# Patient Record
Sex: Female | Born: 1950 | Race: White | Hispanic: No | Smoking: Never smoker
Health system: Southern US, Community
[De-identification: ages and names within clinical notes are randomized; demographics above are authoritative.]

## PROBLEM LIST (undated history)

## (undated) DIAGNOSIS — Z8619 Personal history of other infectious and parasitic diseases: Secondary | ICD-10-CM

## (undated) DIAGNOSIS — I10 Essential (primary) hypertension: Secondary | ICD-10-CM

## (undated) DIAGNOSIS — R011 Cardiac murmur, unspecified: Secondary | ICD-10-CM

## (undated) HISTORY — DX: Cardiac murmur, unspecified: R01.1

## (undated) HISTORY — DX: Essential (primary) hypertension: I10

## (undated) HISTORY — DX: Personal history of other infectious and parasitic diseases: Z86.19

---

## 1986-03-09 HISTORY — PX: OVARIAN CYST SURGERY: SHX726

## 1988-11-07 HISTORY — PX: LASER ABLATION/CAUTERIZATION OF ENDOMETRIAL IMPLANTS: SHX1951

## 2009-03-09 LAB — HM MAMMOGRAPHY

## 2009-03-09 LAB — HM PAP SMEAR

## 2011-12-25 ENCOUNTER — Ambulatory Visit (INDEPENDENT_AMBULATORY_CARE_PROVIDER_SITE_OTHER): Payer: BC Managed Care – PPO | Admitting: Internal Medicine

## 2011-12-25 ENCOUNTER — Encounter: Payer: Self-pay | Admitting: Internal Medicine

## 2011-12-25 VITALS — BP 172/92 | HR 76 | Temp 97.9°F | Ht 63.75 in | Wt 138.0 lb

## 2011-12-25 DIAGNOSIS — I10 Essential (primary) hypertension: Secondary | ICD-10-CM | POA: Insufficient documentation

## 2011-12-25 DIAGNOSIS — R5383 Other fatigue: Secondary | ICD-10-CM

## 2011-12-25 DIAGNOSIS — R5381 Other malaise: Secondary | ICD-10-CM

## 2011-12-25 DIAGNOSIS — R079 Chest pain, unspecified: Secondary | ICD-10-CM

## 2011-12-25 LAB — COMPREHENSIVE METABOLIC PANEL
ALT: 27 U/L (ref 0–35)
Albumin: 4.4 g/dL (ref 3.5–5.2)
CO2: 29 mEq/L (ref 19–32)
Chloride: 103 mEq/L (ref 96–112)
GFR: 102.87 mL/min (ref >60.00)
Glucose, Bld: 76 mg/dL (ref 70–99)
Potassium: 4.7 mEq/L (ref 3.5–5.1)
Sodium: 139 mEq/L (ref 135–145)
Total Bilirubin: 0.5 mg/dL (ref 0.3–1.2)
Total Protein: 7.4 g/dL (ref 6.0–8.3)

## 2011-12-25 LAB — CBC WITH DIFFERENTIAL/PLATELET
Basophils Absolute: 0.1 10*3/uL (ref 0.0–0.1)
Eosinophils Relative: 1.5 % (ref 0.0–5.0)
HCT: 39.6 % (ref 36.0–46.0)
Lymphocytes Relative: 21 % (ref 12.0–46.0)
Lymphs Abs: 2.1 10*3/uL (ref 0.7–4.0)
Monocytes Relative: 6 % (ref 3.0–12.0)
Neutrophils Relative %: 70.8 % (ref 43.0–77.0)
Platelets: 242 10*3/uL (ref 150.0–400.0)
RDW: 12.9 % (ref 11.5–14.6)
WBC: 9.9 10*3/uL (ref 4.5–10.5)

## 2011-12-25 LAB — POCT URINALYSIS DIPSTICK
Blood, UA: NEGATIVE
Glucose, UA: NEGATIVE
Spec Grav, UA: 1.02
Urobilinogen, UA: 0.2
pH, UA: 7

## 2011-12-25 LAB — TSH: TSH: 0.49 u[IU]/mL (ref 0.35–5.50)

## 2011-12-25 MED ORDER — TRIAMCINOLONE ACETONIDE 0.1 % EX CREA: TOPICAL_CREAM | Freq: Two times a day (BID) | CUTANEOUS | Status: DC

## 2011-12-25 MED ORDER — LISINOPRIL 10 MG PO TABS: 10.0000 mg | ORAL_TABLET | Freq: Every day | ORAL | Status: DC

## 2011-12-25 NOTE — Patient Instructions (Addendum)
It was nice meeting you today.  I am going to start you on a blood pressure medicine (Lisinopril).  Take one per day.  We will notify you of your lab results once they become available.  Stress test will be scheduled today.  If any problems - needs evaluation.

## 2011-12-27 ENCOUNTER — Encounter: Payer: Self-pay | Admitting: Internal Medicine

## 2011-12-27 NOTE — Assessment & Plan Note (Signed)
Blood pressure elevated.  New diagnosis.  Check metabolic panel and urinalysis.  Start Lisinopril 10mg  q day.  Will need Cr and potassium checked 10-14 days after starting an ACE inhibitor.

## 2011-12-27 NOTE — Progress Notes (Signed)
Subjective:    Patient ID: Ariana Hartman, female    DOB: April 03, 1950, 61 y.o.   MRN: [id number]  HPI 61 year old female who presents to establish care.  Went to a vascular screening this week.  Blood pressure was noted to be 221/112.  They wanted an ER evaluation.  She declined.  States she has never been told she has high blood pressure.  Was last evaluated by Dr Carola Frost in [place].  Had mammogram and pap - 2011.  (Last time seen).  She does report increased anxiety and stress - mostly with her job.  Her family has moved.  She is trying to help take care of her father who resides in [place] in [place].    She does report increased chest pain - intermittent episodes.  She has related these pains to increased stress.  Will occasionally be sharp and then dull.  May lasts one to two minutes.  Some sob.  No nausea or vomiting.  She had a head injury at work (04/2011).  Had staple.  Negative MRI.  Two weeks prior to this injury, she tripped over her cat.  Hit right side of her head on the door jam.  Since, the right side of her head will feel "hot" at times.  Some intermittent headaches.  No significant headaches and no dizziness.  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  No vision change.   She also reports noticing a rash, flaking and itching over her elbows and knees.  Has used otc creams without relief.  Has not used any steroid creams.    Past Medical History  Diagnosis Date  . History of chicken pox   . Heart murmur   . Hypertension     Review of Systems No significant light headedness or dizziness.  No palpitations.  No cough or congestion.  Chest pain and sob as outlined above.   No nausea or vomiting.  No abdominal pain or cramping.  No bowel change, such as diarrhea, constipation, BRBPR or melana.  No urine change.        Objective:   Physical Exam Filed Vitals:   12/25/11 0919  BP: 172/92  Pulse: 76  Temp: 97.9 F (36.6 C)   Blood pressure recheck:  43/88  61year old female in no acute  distress.   HEENT:  Nares - clear.  OP- without lesions or erythema.  NECK:  Supple, nontender.  No audible carotid bruit.   HEART:  Appears to be regular.  I-II/VI systolic murmur.  (pt aware).   LUNGS:  Without crackles or wheezing audible.  Respirations even and unlabored.   RADIAL PULSE:  Equal bilaterally.  ABDOMEN:  Soft, nontender.  No audible abdominal bruit.   EXTREMITIES:  No increased edema to be present.  DP pulses palpable and equal bilaterally.  (2 plus) SKIN:  Erythematous based - white scaly rash over elbows and knees.       Assessment & Plan:  RASH.  Appears to be consistent with possible psoriasis.  Will give her a trial of Triamcinolone cream .1%.  Apply to affected area bid for 7-10 days.  Follow.   CARDIOVASCULAR.  Chest pain as outlined.  EKG obtained and revealed SR with no acute ischemic changes.  Schedule a stress echo to confirm no active ischemia.  Continue risk factor modification.   FATIGUE.  Check cbc, met c and tsh.   INCREASED PSYCHOSOCIAL STRESSORS.  Discussed at length with her today.  Will treat blood pressure.  Follow  closely.  Does not feel she needs any further intervention at this point.   HEALTH MAINTENANCE.  Will get her physical scheduled.  Will discuss further health maintenance issues with her at her physical.

## 2011-12-30 ENCOUNTER — Telehealth: Payer: Self-pay | Admitting: Internal Medicine

## 2011-12-30 NOTE — Telephone Encounter (Signed)
Pt says she received a call for test results and was needing a call back

## 2012-01-01 ENCOUNTER — Encounter: Payer: Self-pay | Admitting: Internal Medicine

## 2012-01-01 ENCOUNTER — Ambulatory Visit (INDEPENDENT_AMBULATORY_CARE_PROVIDER_SITE_OTHER): Payer: BC Managed Care – PPO | Admitting: Internal Medicine

## 2012-01-01 VITALS — BP 140/88 | HR 63 | Temp 97.8°F | Ht 63.75 in | Wt 140.8 lb

## 2012-01-01 DIAGNOSIS — I10 Essential (primary) hypertension: Secondary | ICD-10-CM

## 2012-01-01 NOTE — Telephone Encounter (Signed)
Patient was seen in office today and results were given per Dr Lorin Picket.

## 2012-01-01 NOTE — Progress Notes (Signed)
  Subjective:    Patient ID: Ariana Hartman, female    DOB: 1950/08/17, 61 y.o.   MRN: [id number]  HPI 61 year old female with past history of newly diagnosed hypertension who comes in today as a work in.  She was scheduled to follow up on her blood pressure next week.  Blood pressure on initial check here was improved.  States she started feeling anxious a few days ago.  Noticed increased heart rate when this occurred.  She states that through the day (that day) - she felt drunk and felt like she was slurring her words.  Her coworkers did not appreciate any slurring of speech.  States she felt a little "drunk".  Noticed that evening what she reports as hallucinations.  States she was seeing tiny insects crawling on the floor.  This resolved after that night.  She is having some headache now.  She relates this to starting the Lisinopril.  Planning to get her stress test when she leaves here.   Past Medical History  Diagnosis Date  . History of chicken pox   . Heart murmur   . Hypertension     Review of Systems Patient reports headache and some intermittent dizziness as outlined above.  Still with some intermittent chest pain, but reports no tightness or palpitations. No pain currently.  No increased shortness of breath, cough or congestion.  No nausea or vomiting.  No abdominal pain or cramping.  No bowel change, such as diarrhea, constipation, BRBPR or melana.  No urine change.  Still dealing with increased stress.      Objective:   Physical Exam Filed Vitals:   01/01/12 1009  BP: 140/88  Pulse: 63  Temp: 97.8 F (71.25 C)   61 year old female in no acute distress.   HEENT:  Nares - clear.  OP- without lesions or erythema.  NECK:  Supple, nontender.  No audible carotid bruit.   HEART:  Appears to be regular. LUNGS:  Without crackles or wheezing audible.  Respirations even and unlabored.   RADIAL PULSE:  Equal bilaterally.  ABDOMEN:  Soft, nontender.  No audible abdominal bruit.     EXTREMITIES:  No increased edema to be present.                    Assessment & Plan:  HEADACHE.  She relates the headache to starting the Lisinopril.  We discussed this at length today along with the hallucinations and her perception of slurred speech.  Discussed the need for further w/up - including MRI (brain) and carotid ultrasound.  She declines any further w/up currently.  Discussed the importance of early detection, etc.  Will notify me if she changes her mind.  Start daily aspirin.  If any reoccurring symptoms or persistent headache, needs reevaluation.   CARDIOVASCULAR.  Is scheduled for the stress test today.  Continue risk factor modification.    INCREASED PSYCHOSOCIAL STRESSORS.  Discussed again with her today.  Follow.

## 2012-01-01 NOTE — Patient Instructions (Signed)
It was nice seeing you today.  I am sorry you have been having some issues. I do want to go ahead and increase you blood pressure medicine to 20mg  of lisinopril.  You have 10mg  tablets.  You will need to take two of these daily.  Monitor blood pressure and let me know if persistent elevation.  If any acute change, I want you evaluated - as we discussed.

## 2012-01-03 ENCOUNTER — Encounter: Payer: Self-pay | Admitting: Internal Medicine

## 2012-01-03 NOTE — Assessment & Plan Note (Signed)
Blood pressure elevated on my recheck.  Increase Lisinopril to 20mg  q day.  Have her monitor her blood pressure closely.  Check met b.  Get her back in soon to reassess.  If persistent elevation, will change the Lisinopril to Lisinopril/HCTZ.

## 2012-01-04 ENCOUNTER — Ambulatory Visit: Payer: BC Managed Care – PPO | Admitting: Internal Medicine

## 2012-01-05 ENCOUNTER — Telehealth: Payer: Self-pay | Admitting: Internal Medicine

## 2012-01-05 NOTE — Telephone Encounter (Signed)
Notify pt stress echo - negative for ischemia.  Ok.

## 2012-01-06 NOTE — Telephone Encounter (Signed)
Patient returned call and I informed her of her results.

## 2012-01-06 NOTE — Telephone Encounter (Signed)
Left message at home for patient to return call.

## 2012-01-08 ENCOUNTER — Other Ambulatory Visit (INDEPENDENT_AMBULATORY_CARE_PROVIDER_SITE_OTHER): Payer: BC Managed Care – PPO

## 2012-01-08 DIAGNOSIS — I10 Essential (primary) hypertension: Secondary | ICD-10-CM

## 2012-01-09 LAB — BASIC METABOLIC PANEL WITH GFR
Calcium: 9.5 mg/dL (ref 8.4–10.5)
GFR, Est African American: >89 mL/min
GFR, Est Non African American: >89 mL/min
Glucose, Bld: 85 mg/dL (ref 70–99)
Potassium: 4.8 mEq/L (ref 3.5–5.3)
Sodium: 138 mEq/L (ref 135–145)

## 2012-01-12 ENCOUNTER — Telehealth: Payer: Self-pay | Admitting: Internal Medicine

## 2012-01-12 NOTE — Telephone Encounter (Signed)
°  Pt called to get refill on her bp meds Lisinopril 10mg  pills.  Take 2 pills daily [facility] [address] Pt is completely out of her meds

## 2012-01-15 ENCOUNTER — Telehealth: Payer: Self-pay | Admitting: *Deleted

## 2012-01-15 NOTE — Telephone Encounter (Signed)
Patient was able to get an early refill due to increased dose, has plenty of meds until seen by Dr. Lorin Picket on 111/18. increased family stress ( family moving out of [place])but coping.

## 2012-01-20 ENCOUNTER — Encounter: Payer: Self-pay | Admitting: *Deleted

## 2012-01-20 NOTE — Telephone Encounter (Signed)
See phone note dated 01-12-12 in regards to refills

## 2012-01-25 ENCOUNTER — Ambulatory Visit (INDEPENDENT_AMBULATORY_CARE_PROVIDER_SITE_OTHER): Payer: BC Managed Care – PPO | Admitting: Internal Medicine

## 2012-01-25 ENCOUNTER — Encounter: Payer: Self-pay | Admitting: Internal Medicine

## 2012-01-25 VITALS — BP 136/84 | HR 65 | Temp 98.1°F | Ht 63.75 in | Wt 140.2 lb

## 2012-01-25 DIAGNOSIS — R21 Rash and other nonspecific skin eruption: Secondary | ICD-10-CM

## 2012-01-25 DIAGNOSIS — I10 Essential (primary) hypertension: Secondary | ICD-10-CM

## 2012-01-25 DIAGNOSIS — H538 Other visual disturbances: Secondary | ICD-10-CM

## 2012-01-25 MED ORDER — LISINOPRIL-HYDROCHLOROTHIAZIDE 20-12.5 MG PO TABS: 1.0000 | ORAL_TABLET | Freq: Every day | ORAL | Status: DC

## 2012-01-25 NOTE — Patient Instructions (Addendum)
It was good to see you today.  I am sorry you have not been feeling well.  We are going to schedule a MRI of your brain and a carotid ultrasound. You need to be evaluated if you have any reoccurring problems.

## 2012-02-07 ENCOUNTER — Encounter: Payer: Self-pay | Admitting: Internal Medicine

## 2012-02-07 NOTE — Assessment & Plan Note (Addendum)
Blood pressure still elevated.  Will stop lisinopril and start lisinopril/HCTZ 20/12.5 q day.  Have her follow her pressures.  Get her back in soon to reassess.  Recent metabolic panel wnl.

## 2012-02-07 NOTE — Progress Notes (Signed)
Subjective:    Patient ID: Ariana Hartman, female    DOB: [birth year], 61 y.o.   MRN: [id number]  HPI 62 year old female with past history of hypertension who comes in today for a scheduled follow up.  Blood pressure still elevated.  Taking Lisinopril 20mg  q day.  States she just doesn't feel well.  No energy.  Does have some depression.  Family moving away.  Taking care of her father.  Increased stress at work.  Just had a stress test that was negative for ischemia.  She had an episode recently where she noticed left eye blurring.  Noticed after she awoke.  Lasted for several hours.  States she just could not focus while trying to read.  No actual vision loss.  She notices some increased gas and nausea.  Some headache - persistent.  She has attributed this to the lisinopril.  No slurring of speech and no other focal neuro deficits.  Eating and drinking.  No vomiting.  Bowels stable.    Past Medical History  Diagnosis Date  . History of chicken pox   . Heart murmur   . Hypertension     Review of Systems Patient denies any lightheadedness or dizziness.  She does report headache.  Persistent.  Worsening at times.  No further vision change.   No chest pain, tightness or palpitations.  No increased shortness of breath, cough or congestion.  No vomiting.  Some occasional nausea and increased gas.  No abdominal pain or cramping.  No bowel change, such as diarrhea, constipation, BRBPR or melana.  No urine change.   Increased stress.  No suicidal ideations currently.  States she would not hurt herself.  Discussed counseling.  She declines.       Objective:   Physical Exam Filed Vitals:   01/25/12 0911  BP: 136/84  Pulse: 65  Temp: 98.1 F (36.7 C)   Blood pressure recheck:  58/78  60 year old female in no acute distress.   HEENT:  Nares - clear.  OP- without lesions or erythema.  NECK:  Supple, nontender.  No audible bruit.   HEART:  Appears to be regular. LUNGS:  Without crackles or  wheezing audible.  Respirations even and unlabored.   RADIAL PULSE:  Equal bilaterally.  ABDOMEN:  Soft, nontender.  No audible abdominal bruit.   EXTREMITIES:  No increased edema to be present.                     Assessment & Plan:  HEADACHE.  Persistent problem.  Treating blood pressure.  Given persistent/new headache - will obtain MRI to confirm no structural abnormality.  Adjust blood pressure medicine as outlined.    VISUAL DISTURBANCE.  Describes the blurring as outlined.  No actual vision loss.  Hard to focus.  No slurring of speech or other neurological changes.  Continue daily aspirin.  Obtain MRI brain and carotid ultrasound.  Will also have her see neurology.  If any reoccurrence or problems, she is to be reevaluated immediately.   INCREASED PSYCHOSOCIAL STRESSORS.  See above.  Discussed counseling.  She declines.  Assures me she will not hurt herself.  She would like to hold on medication.  Follow.  She is to be evaluated if any problems or symptom change.    CARDIOVASCULAR.  Recent stress test negative.  Currently asymptomatic.  Follow.  Continue risk factor modification.   PERSISTENT RASH.  Triamcinolone cream did not help.  Will refer to dermatology for  evaluation and treatment.

## 2012-02-08 ENCOUNTER — Ambulatory Visit: Payer: Self-pay | Admitting: Internal Medicine

## 2012-02-09 ENCOUNTER — Telehealth: Payer: Self-pay | Admitting: Internal Medicine

## 2012-02-09 NOTE — Telephone Encounter (Signed)
Notify pt mri (brain) - ok.  No acute findings.

## 2012-02-10 ENCOUNTER — Telehealth: Payer: Self-pay | Admitting: Internal Medicine

## 2012-02-10 NOTE — Telephone Encounter (Signed)
Notify pt carotid ultrasound - shows no hemodynamically significant stenosis.  Ok.  She also needs a follow up appt with me within the next 2-3 weeks.  Please schedule a 30 min appt

## 2012-02-10 NOTE — Telephone Encounter (Signed)
Patient returned your call the radiologist had told her the results of her MRI.

## 2012-02-10 NOTE — Telephone Encounter (Signed)
Called with MRI results;voice messaging system not set up. Will call tomorrow.

## 2012-02-12 NOTE — Telephone Encounter (Signed)
Lft msg on patients voice mail regarding results and for her to call office for appoint with Dr. Lorin Picket in 3 wks.

## 2012-02-13 ENCOUNTER — Telehealth: Payer: Self-pay | Admitting: Internal Medicine

## 2012-02-13 NOTE — Telephone Encounter (Signed)
Please call patient and schedule a follow up appt with me in 2-3 weeks (to follow up on her blood pressure).  Will need 30 min.  Thanks.

## 2012-02-16 NOTE — Telephone Encounter (Signed)
Left message for pt to call office

## 2012-02-17 NOTE — Telephone Encounter (Signed)
Ariana Hartman made appointment 12/20

## 2012-02-18 NOTE — Telephone Encounter (Signed)
Per Patient appoint scheduled 12/20/@10 :00

## 2012-02-22 ENCOUNTER — Encounter: Payer: Self-pay | Admitting: Internal Medicine

## 2012-02-23 ENCOUNTER — Telehealth: Payer: Self-pay | Admitting: Internal Medicine

## 2012-02-23 NOTE — Telephone Encounter (Signed)
Needs a refill on lisinopril/HCTZ. Please send to [facility] [address]

## 2012-02-25 NOTE — Telephone Encounter (Signed)
This has already been filled and picked up by patient

## 2012-02-26 ENCOUNTER — Ambulatory Visit (INDEPENDENT_AMBULATORY_CARE_PROVIDER_SITE_OTHER): Payer: BC Managed Care – PPO | Admitting: Internal Medicine

## 2012-02-26 ENCOUNTER — Encounter: Payer: Self-pay | Admitting: Internal Medicine

## 2012-02-26 VITALS — BP 156/92 | HR 71 | Temp 98.2°F | Ht 63.75 in | Wt 139.8 lb

## 2012-02-26 DIAGNOSIS — I10 Essential (primary) hypertension: Secondary | ICD-10-CM

## 2012-02-26 LAB — BASIC METABOLIC PANEL
Chloride: 100 mEq/L (ref 96–112)
GFR: 100.96 mL/min (ref >60.00)
Potassium: 4.3 mEq/L (ref 3.5–5.1)
Sodium: 140 mEq/L (ref 135–145)

## 2012-02-26 NOTE — Patient Instructions (Signed)
I am glad you are feeling better.  I want you to start Zantac (ranitidine) 150mg  - one per day.  Let me know if you have any more problems.

## 2012-02-27 ENCOUNTER — Encounter: Payer: Self-pay | Admitting: Internal Medicine

## 2012-02-27 NOTE — Progress Notes (Signed)
Subjective:    Patient ID: Ariana Hartman, female    DOB: [birth year], 61 y.o.   MRN: [id number]  HPI 61 year old female with past history of hypertension who comes in today for a scheduled follow up.  States she feels better.  Still under a lot of stress with her house.  Needing a lot of repairs.  Increased financial concerns.  Her father is now staying with her sister.  This has helped her stress.  Blood pressure has been doing better.  States averaging 114-119/70s.  Feels better.  No slurring of speech and no other focal neuro deficits.  Did see neurology.  Felt to have possibly had a TIA.  Taking aspirin daily now.  No reoccurring symptoms.  Eating and drinking well.   No vomiting.  Bowels stable.    Past Medical History  Diagnosis Date  . History of chicken pox   . Heart murmur   . Hypertension     Current Outpatient Prescriptions on File Prior to Visit  Medication Sig Dispense Refill  . b complex vitamins tablet Take 1 tablet by mouth daily.      Marland Kitchen lisinopril-hydrochlorothiazide (ZESTORETIC) 20-12.5 MG per tablet Take 1 tablet by mouth daily.  30 tablet  2  . triamcinolone cream (KENALOG) 0.1 % Apply topically 2 (two) times daily. Apply to affected areas bid for 7-10 days.  30 g  0    Review of Systems Patient denies any lightheadedness or dizziness. No significant headache.  No vision change.   No chest pain, tightness or palpitations.  No increased shortness of breath, cough or congestion.  No vomiting.  No abdominal pain or cramping.  She does describe some burning in her chest.  Notices more in the am.  No pain with increased activity or exertion.  No bowel change, such as diarrhea, constipation, BRBPR or melana.  No urine change.   Increased stress.  Feels she is handling things relatively well.       Objective:   Physical Exam  Filed Vitals:   02/26/12 1005  BP: 156/92  Pulse: 71  Temp: 98.2 F (36.8 C)   Blood pressure recheck:  136-28/95-62  61 year old female in  no acute distress.   HEENT:  Nares - clear.  OP- without lesions or erythema.  NECK:  Supple, nontender.  No audible bruit.   HEART:  Appears to be regular. LUNGS:  Without crackles or wheezing audible.  Respirations even and unlabored.   RADIAL PULSE:  Equal bilaterally.  ABDOMEN:  Soft, nontender.  No audible abdominal bruit.   EXTREMITIES:  No increased edema to be present.                     Assessment & Plan:  HEADACHE.  Not a problem now.  Blood pressure better.  Follow.      VISUAL DISTURBANCE.  Described the blurring as outlined previously.  See previous notes.  Had MRI and carotid ultrasound.  Carotid ultrasound revealed no significant stenosis.  MRI revealed no acute changes.  Saw Neurology.  See Dr Daisy Blossom note for details.  Previous changes felt to possibly be due to a TIA.  Continue daily aspirin.  Follow.  Has had no reoccurring symptoms.    INCREASED PSYCHOSOCIAL STRESSORS.  Doing better. Follow.    CARDIOVASCULAR.  Recent stress test negative.  Currently asymptomatic.  Follow.  Continue risk factor modification.   PERSISTENT RASH.  Triamcinolone cream did not help.  Referred to dermatology  for evaluation and treatment.  Has appt 03/10/12.

## 2012-02-27 NOTE — Assessment & Plan Note (Signed)
Blood pressure under better control.  Continue same medication.  Follow.  Check metabolic panel today.

## 2012-03-21 ENCOUNTER — Other Ambulatory Visit: Payer: Self-pay | Admitting: Internal Medicine

## 2012-03-21 ENCOUNTER — Other Ambulatory Visit: Payer: Self-pay | Admitting: General Practice

## 2012-03-21 MED ORDER — LISINOPRIL-HYDROCHLOROTHIAZIDE 20-12.5 MG PO TABS: 1.0000 | ORAL_TABLET | Freq: Every day | ORAL | Status: DC

## 2012-03-21 NOTE — Telephone Encounter (Signed)
Sent in to pharmacy.  

## 2012-03-21 NOTE — Telephone Encounter (Signed)
Pt is needing refill of Lisinopril HCTZ 20-125 mg. Pt says a 90 day supply is much cheaper. [facility] in [place]

## 2012-05-27 ENCOUNTER — Ambulatory Visit (INDEPENDENT_AMBULATORY_CARE_PROVIDER_SITE_OTHER): Payer: BC Managed Care – PPO | Admitting: Internal Medicine

## 2012-05-27 ENCOUNTER — Encounter: Payer: Self-pay | Admitting: Internal Medicine

## 2012-05-27 VITALS — BP 110/70 | HR 79 | Temp 98.4°F | Ht 63.75 in | Wt 142.0 lb

## 2012-05-27 DIAGNOSIS — Z1239 Encounter for other screening for malignant neoplasm of breast: Secondary | ICD-10-CM

## 2012-05-27 DIAGNOSIS — G459 Transient cerebral ischemic attack, unspecified: Secondary | ICD-10-CM

## 2012-05-27 DIAGNOSIS — I10 Essential (primary) hypertension: Secondary | ICD-10-CM

## 2012-05-27 DIAGNOSIS — L408 Other psoriasis: Secondary | ICD-10-CM

## 2012-05-27 DIAGNOSIS — L409 Psoriasis, unspecified: Secondary | ICD-10-CM

## 2012-05-27 MED ORDER — LISINOPRIL-HYDROCHLOROTHIAZIDE 20-12.5 MG PO TABS: 1.0000 | ORAL_TABLET | Freq: Every day | ORAL | Status: DC

## 2012-05-29 ENCOUNTER — Encounter: Payer: Self-pay | Admitting: Internal Medicine

## 2012-05-29 DIAGNOSIS — L409 Psoriasis, unspecified: Secondary | ICD-10-CM | POA: Insufficient documentation

## 2012-05-29 DIAGNOSIS — G459 Transient cerebral ischemic attack, unspecified: Secondary | ICD-10-CM | POA: Insufficient documentation

## 2012-05-29 NOTE — Assessment & Plan Note (Signed)
Blood pressure under better control.  Continue same medication.  Follow.  Check metabolic panel.

## 2012-05-29 NOTE — Progress Notes (Signed)
  Subjective:    Patient ID: Ariana Hartman, female    DOB: Jun 17, 1950, 62 y.o.   MRN: [id number]  HPI 62 year old female with past history of hypertension who comes in today for a scheduled follow up.  States she feels better.  She reports feeling good.  She was able to get her house fixed.  Handling stress well.  Blood pressure has been under good control.  No headache or lightheadedness/dizziness.  No chest pain or tightness.  Breathing doing well.  Seeing dermatology for her psoriasis.  The rx cream is helping.  Bowels stable.    Past Medical History  Diagnosis Date  . History of chicken pox   . Heart murmur   . Hypertension     Review of Systems Patient denies any lightheadedness or dizziness. No significant headache.  No vision change.   No chest pain, tightness or palpitations.  No increased shortness of breath, cough or congestion.  No vomiting.  No abdominal pain or cramping.  No bowel change, such as diarrhea, constipation, BRBPR or melana.  No urine change.   Overall she feels she is doing well.        Objective:   Physical Exam  Filed Vitals:   05/27/12 1011  BP: 110/70  Pulse: 79  Temp: 98.4 F (36.9 C)   Blood pressure recheck:  120/72, pulse 42  62 year old female in no acute distress.   HEENT:  Nares - clear.  OP- without lesions or erythema.  NECK:  Supple, nontender.  No audible bruit.   HEART:  Appears to be regular. LUNGS:  Without crackles or wheezing audible.  Respirations even and unlabored.   RADIAL PULSE:  Equal bilaterally.  ABDOMEN:  Soft, nontender.  No audible abdominal bruit.   EXTREMITIES:  No increased edema to be present.                     Assessment & Plan:  HEADACHE.  Not a problem now.  Blood pressure better.  Follow.      VISUAL DISTURBANCE.  Described the blurring as outlined previously.  See previous notes.  Had MRI and carotid ultrasound.  Carotid ultrasound revealed no significant stenosis.  MRI revealed no acute changes.  Saw  Neurology.  See Dr Daisy Blossom note for details.  Previous changes felt to possibly be due to a TIA.  Continue daily aspirin.  Follow.  Has had no reoccurring symptoms.    INCREASED PSYCHOSOCIAL STRESSORS.  Doing better. Follow.    CARDIOVASCULAR.  Recent stress test negative.  Currently asymptomatic.  Follow.  Continue risk factor modification.   HEALTH MAINTENANCE.  Schedule her for a physical next visit.  Schedule mammogram.  Discuss further health maintenance issues with her then.  Check cholesterol.

## 2012-05-29 NOTE — Assessment & Plan Note (Signed)
No reoccurring symptoms.  Continue daily aspirin.  Follow.   

## 2012-05-29 NOTE — Assessment & Plan Note (Signed)
Seeing dermatology.  Cream helping.  Follow.   

## 2012-06-10 ENCOUNTER — Other Ambulatory Visit (INDEPENDENT_AMBULATORY_CARE_PROVIDER_SITE_OTHER): Payer: BC Managed Care – PPO

## 2012-06-10 DIAGNOSIS — I10 Essential (primary) hypertension: Secondary | ICD-10-CM

## 2012-06-10 LAB — COMPREHENSIVE METABOLIC PANEL
Albumin: 4.4 g/dL (ref 3.5–5.2)
Alkaline Phosphatase: 75 U/L (ref 39–117)
BUN: 17 mg/dL (ref 6–23)
Calcium: 9.1 mg/dL (ref 8.4–10.5)
Chloride: 103 mEq/L (ref 96–112)
Glucose, Bld: 95 mg/dL (ref 70–99)
Potassium: 4.5 mEq/L (ref 3.5–5.1)

## 2012-06-10 LAB — LDL CHOLESTEROL, DIRECT: Direct LDL: 152.6 mg/dL

## 2012-06-10 LAB — LIPID PANEL: HDL: 72 mg/dL (ref >39.00)

## 2012-06-22 ENCOUNTER — Telehealth: Payer: Self-pay

## 2012-06-22 NOTE — Telephone Encounter (Signed)
Please close encounter

## 2012-06-22 NOTE — Telephone Encounter (Signed)
Returned pt phone message that she left on the voicemail about some lab work. Left message for pt  to give a call back to the office.

## 2012-07-12 ENCOUNTER — Ambulatory Visit: Payer: Self-pay | Admitting: Internal Medicine

## 2012-07-18 ENCOUNTER — Encounter: Payer: Self-pay | Admitting: Internal Medicine

## 2012-07-27 ENCOUNTER — Ambulatory Visit (INDEPENDENT_AMBULATORY_CARE_PROVIDER_SITE_OTHER): Payer: BC Managed Care – PPO | Admitting: Internal Medicine

## 2012-07-27 ENCOUNTER — Encounter: Payer: Self-pay | Admitting: Internal Medicine

## 2012-07-27 ENCOUNTER — Other Ambulatory Visit (HOSPITAL_COMMUNITY)
Admission: RE | Admit: 2012-07-27 | Discharge: 2012-07-27 | Disposition: A | Discharge status: Home / Self Care | Payer: BC Managed Care – PPO | Source: Ambulatory Visit | Attending: Internal Medicine | Admitting: Internal Medicine

## 2012-07-27 VITALS — BP 130/70 | HR 60 | Temp 97.8°F | Ht 64.0 in | Wt 137.8 lb

## 2012-07-27 DIAGNOSIS — E7800 Pure hypercholesterolemia, unspecified: Secondary | ICD-10-CM

## 2012-07-27 DIAGNOSIS — Z124 Encounter for screening for malignant neoplasm of cervix: Secondary | ICD-10-CM

## 2012-07-27 DIAGNOSIS — I10 Essential (primary) hypertension: Secondary | ICD-10-CM

## 2012-07-27 DIAGNOSIS — Z1151 Encounter for screening for human papillomavirus (HPV): Secondary | ICD-10-CM | POA: Insufficient documentation

## 2012-07-27 DIAGNOSIS — L659 Nonscarring hair loss, unspecified: Secondary | ICD-10-CM

## 2012-07-27 DIAGNOSIS — Z01419 Encounter for gynecological examination (general) (routine) without abnormal findings: Secondary | ICD-10-CM | POA: Insufficient documentation

## 2012-07-27 DIAGNOSIS — L409 Psoriasis, unspecified: Secondary | ICD-10-CM

## 2012-07-27 DIAGNOSIS — L408 Other psoriasis: Secondary | ICD-10-CM

## 2012-07-27 DIAGNOSIS — Z1211 Encounter for screening for malignant neoplasm of colon: Secondary | ICD-10-CM

## 2012-07-27 DIAGNOSIS — G459 Transient cerebral ischemic attack, unspecified: Secondary | ICD-10-CM

## 2012-07-29 ENCOUNTER — Telehealth: Payer: Self-pay | Admitting: *Deleted

## 2012-07-29 ENCOUNTER — Encounter: Payer: Self-pay | Admitting: *Deleted

## 2012-07-29 ENCOUNTER — Other Ambulatory Visit (INDEPENDENT_AMBULATORY_CARE_PROVIDER_SITE_OTHER): Payer: BC Managed Care – PPO

## 2012-07-29 DIAGNOSIS — Z1211 Encounter for screening for malignant neoplasm of colon: Secondary | ICD-10-CM

## 2012-07-29 NOTE — Progress Notes (Signed)
LMTCB/ext 105 

## 2012-07-31 ENCOUNTER — Encounter: Payer: Self-pay | Admitting: Internal Medicine

## 2012-07-31 DIAGNOSIS — E7800 Pure hypercholesterolemia, unspecified: Secondary | ICD-10-CM | POA: Insufficient documentation

## 2012-07-31 NOTE — Assessment & Plan Note (Signed)
Seeing dermatology.  Cream helping.  Follow.   

## 2012-07-31 NOTE — Assessment & Plan Note (Signed)
Blood pressure under better control.  Continue same medication.  Follow.  Follow metabolic panel.   

## 2012-07-31 NOTE — Assessment & Plan Note (Signed)
Lipid profile 06/10/12 revealed total cholesterol 229, triglycerides 39, HDL 72 and LDL 153.  Continue low cholesterol diet and exercise.  If cholesterol remains elevated, will require medication.  Follow.

## 2012-07-31 NOTE — Progress Notes (Signed)
Subjective:    Patient ID: Ariana Hartman, female    DOB: [birth year], 62 y.o.   MRN: [id number]  HPI 62 year old female with past history of hypertension who comes in today to follow up on this as well as for a complete physical exam.  States she feels better.  Feels she is handling stress well.  Blood pressure has been under good control.  No headache or lightheadedness/dizziness.  No chest pain or tightness.  Breathing doing well.  Seeing dermatology for her psoriasis.  The rx cream is helping.  Bowels stable.  Some stress with her job.  Looking for a change.  Has noticed some hair loss.  Also reports some vaginal odor.  No significant discharge.  No urine change.  No abdominal pain or cramping.  She has noticed some pain in the base of her right thumb.  Uses her hands a lot.  Some increased swelling.  No increased redness.    Past Medical History  Diagnosis Date  . History of chicken pox   . Heart murmur   . Hypertension     Current Outpatient Prescriptions on File Prior to Visit  Medication Sig Dispense Refill  . b complex vitamins tablet Take 1 tablet by mouth daily.      Marland Kitchen lisinopril-hydrochlorothiazide (ZESTORETIC) 20-12.5 MG per tablet Take 1 tablet by mouth daily.  90 tablet  1   No current facility-administered medications on file prior to visit.    Review of Systems Patient denies any lightheadedness or dizziness. No headache.  No vision change.   No chest pain, tightness or palpitations.  No increased shortness of breath, cough or congestion.  No vomiting.  No acid reflux.  No abdominal pain or cramping.  No bowel change, such as diarrhea, constipation, BRBPR or melana.  No urine change.   Overall she feels she is doing well.  Does report the vaginal odor as outlined.  Some dryness.        Objective:   Physical Exam  Filed Vitals:   07/27/12 1002  BP: 130/70  Pulse: 60  Temp: 97.8 F (36.6 C)   Blood pressure recheck:  84/54  62 year old female in no acute  distress.   HEENT:  Nares- clear.  Oropharynx - without lesions. NECK:  Supple.  Nontender.  No audible bruit.  HEART:  Appears to be regular. LUNGS:  No crackles or wheezing audible.  Respirations even and unlabored.  RADIAL PULSE:  Equal bilaterally.    BREASTS:  No nipple discharge or nipple retraction present.  Could not appreciate any distinct nodules or axillary adenopathy.  ABDOMEN:  Soft, nontender.  Bowel sounds present and normal.  No audible abdominal bruit.  GU:  Normal external genitalia.  Vaginal vault without lesions.  Cervix identified.  Pap performed. Atrophy changes present.  KOH/wet prep sent. Could not appreciate any adnexal masses or tenderness.   RECTAL:  Heme negative.   EXTREMITIES:  No increased edema present.  DP pulses palpable and equal bilaterally.             Assessment & Plan:  HEADACHE.  Not a problem now.  Blood pressure better.  Follow.     MSK.  Pain at the base of the right thumb. Tylenol prn.  Thumb spica.  Follow.     VISUAL DISTURBANCE.  Described the blurring as outlined previously.  See previous notes.  Had MRI and carotid ultrasound.  Carotid ultrasound revealed no significant stenosis.  MRI revealed no acute  changes.  Saw Neurology.  See Dr Daisy Blossom note for details.  Previous changes felt to possibly be due to a TIA.  Continue daily aspirin.  Follow.  Has had no reoccurring symptoms.    INCREASED PSYCHOSOCIAL STRESSORS.  Doing better. Follow.    CARDIOVASCULAR.  Recent stress test negative.  Currently asymptomatic.  Follow.  Continue risk factor modification.   HEALTH MAINTENANCE.  Physical today.  Schedule mammogram.  Needs colonoscopy.  IFOB today.

## 2012-07-31 NOTE — Assessment & Plan Note (Signed)
No reoccurring symptoms.  Continue daily aspirin.  Follow.   

## 2012-08-03 ENCOUNTER — Encounter: Payer: Self-pay | Admitting: *Deleted

## 2012-08-03 NOTE — Telephone Encounter (Signed)
completed

## 2012-08-09 ENCOUNTER — Encounter: Payer: Self-pay | Admitting: Internal Medicine

## 2012-08-26 ENCOUNTER — Telehealth: Payer: Self-pay | Admitting: Internal Medicine

## 2012-08-26 NOTE — Telephone Encounter (Signed)
Please Advise

## 2012-08-26 NOTE — Telephone Encounter (Signed)
Harriett Sine from [place] called to see if she could get a secondary DX code for some labs that the patient had done in the office. You can call her back at [phone]. She was transferred to my voice mail so I am not sure of the Date of service or anything.  Please advise.

## 2012-08-29 NOTE — Telephone Encounter (Signed)
Left message for Ariana Hartman at [facility] [place] [facility] to call back with additional information

## 2012-08-29 NOTE — Telephone Encounter (Signed)
I am not sure what labs they are needing a diagnosis code.  Need more information.  Thanks.

## 2012-09-02 ENCOUNTER — Telehealth: Payer: Self-pay | Admitting: Internal Medicine

## 2012-09-02 NOTE — Telephone Encounter (Signed)
Signed and in your box.

## 2012-09-02 NOTE — Telephone Encounter (Signed)
Form in your folder (just needs your signature)

## 2012-09-02 NOTE — Telephone Encounter (Signed)
Pt dropped off health screening form  °In box °

## 2012-09-02 NOTE — Telephone Encounter (Signed)
Form faxed & left message to notify pt

## 2012-09-20 ENCOUNTER — Telehealth: Payer: Self-pay | Admitting: Internal Medicine

## 2012-09-20 ENCOUNTER — Telehealth: Payer: Self-pay | Admitting: *Deleted

## 2012-09-20 MED ORDER — LISINOPRIL-HYDROCHLOROTHIAZIDE 20-12.5 MG PO TABS: 1.0000 | ORAL_TABLET | Freq: Every day | ORAL | Status: DC

## 2012-09-20 NOTE — Telephone Encounter (Signed)
done

## 2012-09-20 NOTE — Telephone Encounter (Signed)
Refill sent.

## 2012-09-20 NOTE — Telephone Encounter (Signed)
lisinopril-hydrochlorothiazide (ZESTORETIC) 20-12.5 MG per tablet #90

## 2012-09-21 ENCOUNTER — Encounter: Payer: Self-pay | Admitting: Internal Medicine

## 2012-10-17 ENCOUNTER — Other Ambulatory Visit (INDEPENDENT_AMBULATORY_CARE_PROVIDER_SITE_OTHER): Payer: BC Managed Care – PPO

## 2012-10-17 ENCOUNTER — Telehealth: Payer: Self-pay | Admitting: *Deleted

## 2012-10-17 DIAGNOSIS — L659 Nonscarring hair loss, unspecified: Secondary | ICD-10-CM

## 2012-10-17 DIAGNOSIS — E7800 Pure hypercholesterolemia, unspecified: Secondary | ICD-10-CM

## 2012-10-17 DIAGNOSIS — I10 Essential (primary) hypertension: Secondary | ICD-10-CM

## 2012-10-17 LAB — BASIC METABOLIC PANEL
BUN: 16 mg/dL (ref 6–23)
CO2: 27 mEq/L (ref 19–32)
Chloride: 106 mEq/L (ref 96–112)
Glucose, Bld: 81 mg/dL (ref 70–99)
Potassium: 4.3 mEq/L (ref 3.5–5.1)

## 2012-10-17 LAB — HEPATIC FUNCTION PANEL
ALT: 16 U/L (ref 0–35)
Alkaline Phosphatase: 65 U/L (ref 39–117)
Bilirubin, Direct: 0 mg/dL (ref 0.0–0.3)
Total Bilirubin: 0.3 mg/dL (ref 0.3–1.2)
Total Protein: 7 g/dL (ref 6.0–8.3)

## 2012-10-17 LAB — LDL CHOLESTEROL, DIRECT: Direct LDL: 128.1 mg/dL

## 2012-10-17 LAB — LIPID PANEL
HDL: 63.5 mg/dL (ref >39.00)
Total CHOL/HDL Ratio: 3
VLDL: 38 mg/dL (ref 0.0–40.0)

## 2012-10-17 NOTE — Telephone Encounter (Signed)
If she thinks she has an ear infection and can't hear out of her ear, then I think she needs evaluation before 8/25 week.  If work is an issue, then I recommend her going to acute care - open past 5:00.  Seems like can see Raquel most any day.  Thanks.

## 2012-10-17 NOTE — Telephone Encounter (Signed)
Pt came in today and states that she think she has a ear infection, she cant hear out her right ear she states if been like that for a couple weeks, and would like someone to call her, she states she is only available  Aug 25- Aug 29 (to to be able to come in) and if someone can please call her today or tomorrow about thing she will be home  [phone] Best time to reach pt is in the morning

## 2012-10-17 NOTE — Telephone Encounter (Signed)
Patient waiting on a call back to inform her of what she should do.

## 2012-10-18 ENCOUNTER — Encounter: Payer: Self-pay | Admitting: *Deleted

## 2012-10-18 NOTE — Telephone Encounter (Signed)
Pt notified that she needs to be seen. She states that she could come on 8/21 but Dr. Lorin Picket is not in the office. I scheduled her an appt with Raquel

## 2012-10-21 ENCOUNTER — Ambulatory Visit: Payer: BC Managed Care – PPO | Admitting: Internal Medicine

## 2012-10-27 ENCOUNTER — Ambulatory Visit: Payer: BC Managed Care – PPO | Admitting: Adult Health

## 2012-11-14 ENCOUNTER — Telehealth: Payer: Self-pay | Admitting: Internal Medicine

## 2012-11-14 NOTE — Telephone Encounter (Signed)
Patient called stating she is having trouble with coping after the passing on her father. She states she can be working away and just burst into tears. Is there anything that can be called in to help the patient? I also have made an apt for the pt to be seen tomorrow at 915

## 2012-11-14 NOTE — Telephone Encounter (Signed)
Agree.  If she is being seen tomorrow, can discuss with her tomorrow.  Please confirm no acute need that cannot wait until tomorrow.  Thanks.

## 2012-11-14 NOTE — Telephone Encounter (Signed)
Will discuss at appt tomorrow

## 2012-11-15 ENCOUNTER — Ambulatory Visit (INDEPENDENT_AMBULATORY_CARE_PROVIDER_SITE_OTHER): Payer: BC Managed Care – PPO | Admitting: Internal Medicine

## 2012-11-15 ENCOUNTER — Encounter: Payer: Self-pay | Admitting: Internal Medicine

## 2012-11-15 VITALS — BP 122/76 | HR 67 | Temp 97.8°F | Resp 12 | Ht 64.0 in | Wt 134.0 lb

## 2012-11-15 DIAGNOSIS — I10 Essential (primary) hypertension: Secondary | ICD-10-CM

## 2012-11-15 DIAGNOSIS — F439 Reaction to severe stress, unspecified: Secondary | ICD-10-CM

## 2012-11-15 DIAGNOSIS — Z733 Stress, not elsewhere classified: Secondary | ICD-10-CM

## 2012-11-15 DIAGNOSIS — G459 Transient cerebral ischemic attack, unspecified: Secondary | ICD-10-CM

## 2012-11-15 MED ORDER — FLUOXETINE HCL 10 MG PO CAPS: 10.0000 mg | ORAL_CAPSULE | Freq: Every day | ORAL | Status: DC

## 2012-11-15 MED ORDER — LISINOPRIL-HYDROCHLOROTHIAZIDE 20-12.5 MG PO TABS: 1.0000 | ORAL_TABLET | Freq: Every day | ORAL | Status: DC

## 2012-11-18 ENCOUNTER — Encounter: Payer: Self-pay | Admitting: Internal Medicine

## 2012-11-18 DIAGNOSIS — F439 Reaction to severe stress, unspecified: Secondary | ICD-10-CM | POA: Insufficient documentation

## 2012-11-18 NOTE — Progress Notes (Signed)
  Subjective:    Patient ID: Ariana Hartman, female    DOB: 11-23-1950, 62 y.o.   MRN: [id number]  HPI 62 year old female with past history of hypertension who comes in today as a work in with concerns regarding some increased stress/anxiety.  Her father passed away 2012-11-21.  Trying to cope with this.  States she is more emotional.  Cries easily.  Affecting her at work.  Not sleeping well.  When she eats, has some increased gas.  This did not occur until her father's death.   Blood pressure has been under good control.  No headache or lightheadedness/dizziness.  No chest pain or tightness.  Breathing doing well.     Past Medical History  Diagnosis Date  . History of chicken pox   . Heart murmur   . Hypertension     Current Outpatient Prescriptions on File Prior to Visit  Medication Sig Dispense Refill  . b complex vitamins tablet Take 1 tablet by mouth daily.       No current facility-administered medications on file prior to visit.    Review of Systems Patient denies any lightheadedness or dizziness. No headache.  No vision change.   No chest pain, tightness or palpitations.  No increased shortness of breath, cough or congestion.  No vomiting.  No acid reflux.  No abdominal pain or cramping.  No bowel change, such as diarrhea, constipation, BRBPR or melana, but does report the increased gas with eating.  Has been eating better.         Objective:   Physical Exam  Filed Vitals:   11/15/12 0937  BP: 122/76  Pulse: 67  Temp: 97.8 F (36.6 C)  Resp: 6   62 year old female in no acute distress.  HEART:  Appears to be regular. LUNGS:  No crackles or wheezing audible.  Respirations even and unlabored.  RADIAL PULSE:  Equal bilaterally.   ABDOMEN:  Soft, nontender.  Bowel sounds present and normal.  No audible abdominal bruit. Marland Kitchen   EXTREMITIES:  No increased edema present.  DP pulses palpable and equal bilaterally.             Assessment & Plan:  HEADACHE.  Not a problem now.   Blood pressure better.  Follow.     VISUAL DISTURBANCE.  Described the blurring as outlined previously.  See previous notes.  Had MRI and carotid ultrasound.  Carotid ultrasound revealed no significant stenosis.  MRI revealed no acute changes.  Saw Neurology.  See Dr Daisy Blossom note for details.  Previous changes felt to possibly be due to a TIA.  Continue daily aspirin.  Follow.  Has had no reoccurring symptoms.     CARDIOVASCULAR.  Recent stress test negative.  Currently asymptomatic.  Follow.  Continue risk factor modification.   HEALTH MAINTENANCE.  Physical 07/27/12.  Mammogram 07/12/12 - Birads II.  Needs colonoscopy.

## 2012-11-18 NOTE — Assessment & Plan Note (Signed)
Blood pressure under better control.  Continue same medication.  Follow.  Follow metabolic panel.   

## 2012-11-18 NOTE — Assessment & Plan Note (Signed)
Increased stress and some anxiety.  More emotional.  Discussed at length with her today.  Will start prozac 10mg  q day.  Increase as needed.  Follow closely.  Get her back in soon to reassess.

## 2012-11-18 NOTE — Assessment & Plan Note (Signed)
No reoccurring symptoms.  Continue daily aspirin.  Follow.   

## 2012-11-21 ENCOUNTER — Ambulatory Visit: Payer: BC Managed Care – PPO | Admitting: Internal Medicine

## 2012-11-24 ENCOUNTER — Ambulatory Visit: Payer: BC Managed Care – PPO | Admitting: Internal Medicine

## 2012-12-08 ENCOUNTER — Ambulatory Visit (INDEPENDENT_AMBULATORY_CARE_PROVIDER_SITE_OTHER): Payer: BC Managed Care – PPO | Admitting: Internal Medicine

## 2012-12-08 ENCOUNTER — Encounter: Payer: Self-pay | Admitting: Internal Medicine

## 2012-12-08 VITALS — BP 110/64 | HR 78 | Temp 98.4°F | Ht 64.0 in | Wt 136.2 lb

## 2012-12-08 DIAGNOSIS — F439 Reaction to severe stress, unspecified: Secondary | ICD-10-CM

## 2012-12-08 DIAGNOSIS — G459 Transient cerebral ischemic attack, unspecified: Secondary | ICD-10-CM

## 2012-12-08 DIAGNOSIS — E7800 Pure hypercholesterolemia, unspecified: Secondary | ICD-10-CM

## 2012-12-08 DIAGNOSIS — I10 Essential (primary) hypertension: Secondary | ICD-10-CM

## 2012-12-08 DIAGNOSIS — Z733 Stress, not elsewhere classified: Secondary | ICD-10-CM

## 2012-12-08 MED ORDER — FLUOXETINE HCL 10 MG PO CAPS: 10.0000 mg | ORAL_CAPSULE | Freq: Every day | ORAL | Status: DC

## 2012-12-11 ENCOUNTER — Encounter: Payer: Self-pay | Admitting: Internal Medicine

## 2012-12-11 NOTE — Assessment & Plan Note (Signed)
Increased stress and some anxiety.  See last note for details.  Was started on prozac 10mg  q day.  Doing better.  Does not feel she needs anything more.  Follow.

## 2012-12-11 NOTE — Progress Notes (Signed)
  Subjective:    Patient ID: Ariana Hartman, female    DOB: 06/04/1950, 62 y.o.   MRN: [id number]  HPI 62 year old female with past history of hypertension who comes in today for a scheduled follow up.  Her father passed away 2012-11-05.  Trying to cope with this.  She was having issues with being more emotional and trouble sleeping previously.  See last note for details.  Was started on prozac.  Comes in today for f/u.  States she feels better.  Is sleeping better.  Does not feel she needs anything more at this time.   Blood pressure has been under good control.  No headache or lightheadedness/dizziness. No chest pain or tightness.  Breathing doing well.     Past Medical History  Diagnosis Date  . History of chicken pox   . Heart murmur   . Hypertension     Current Outpatient Prescriptions on File Prior to Visit  Medication Sig Dispense Refill  . b complex vitamins tablet Take 1 tablet by mouth daily.      Marland Kitchen lisinopril-hydrochlorothiazide (ZESTORETIC) 20-12.5 MG per tablet Take 1 tablet by mouth daily.  90 tablet  1   No current facility-administered medications on file prior to visit.    Review of Systems Patient denies any lightheadedness or dizziness. No headache.  No vision change.   No chest pain, tightness or palpitations.  No increased shortness of breath, cough or congestion.  No vomiting.  No acid reflux.  No abdominal pain or cramping.  No bowel change, such as diarrhea, constipation, BRBPR or melana.   Has been eating better.  Sleeping better.  prozac working well.  Does not feel she needs anything more at this point.       Objective:   Physical Exam  Filed Vitals:   12/08/12 1612  BP: 110/64  Pulse: 78  Temp: 98.4 F (36.9 C)   Blood pressure recheck:  38/93  62 year old female in no acute distress.  NECK:  Supple.  Non tender.  No audible bruits.  HEART:  Appears to be regular. LUNGS:  No crackles or wheezing audible.  Respirations even and unlabored.  RADIAL  PULSE:  Equal bilaterally.   ABDOMEN:  Soft, nontender.  Bowel sounds present and normal.  No audible abdominal bruit. Marland Kitchen   EXTREMITIES:  No increased edema present.  DP pulses palpable and equal bilaterally.             Assessment & Plan:  HEADACHE.  Not a problem now.  Blood pressure better.  Follow.     VISUAL DISTURBANCE.  Described the blurring as outlined previously.  See previous notes.  Had MRI and carotid ultrasound.  Carotid ultrasound revealed no significant stenosis.  MRI revealed no acute changes.  Saw Neurology.  See Dr Daisy Blossom note for details.  Previous changes felt to possibly be due to a TIA.  Continue daily aspirin.  Follow.  Has had no reoccurring symptoms.     CARDIOVASCULAR.  Recent stress test negative.  Currently asymptomatic.  Follow.  Continue risk factor modification.   HEALTH MAINTENANCE.  Physical 07/27/12.  Mammogram 07/12/12 - Birads II.  Needs colonoscopy.

## 2012-12-11 NOTE — Assessment & Plan Note (Signed)
No reoccurring symptoms.  Continue daily aspirin.  Follow.   

## 2012-12-11 NOTE — Assessment & Plan Note (Signed)
Blood pressure under better control.  Continue same medication.  Follow.  Follow metabolic panel.   

## 2012-12-11 NOTE — Assessment & Plan Note (Addendum)
Lipid profile 06/10/12 revealed total cholesterol 215, triglycerides 190, HDL 64 and LDL 128.  Continue low cholesterol diet and exercise.  If cholesterol remains elevated, will require medication.  Follow.

## 2013-03-12 ENCOUNTER — Other Ambulatory Visit: Payer: Self-pay | Admitting: Internal Medicine

## 2013-03-12 DIAGNOSIS — G459 Transient cerebral ischemic attack, unspecified: Secondary | ICD-10-CM

## 2013-03-12 DIAGNOSIS — I10 Essential (primary) hypertension: Secondary | ICD-10-CM

## 2013-03-12 DIAGNOSIS — E7800 Pure hypercholesterolemia, unspecified: Secondary | ICD-10-CM

## 2013-03-12 NOTE — Progress Notes (Signed)
Labs ordered.

## 2013-03-13 ENCOUNTER — Encounter (INDEPENDENT_AMBULATORY_CARE_PROVIDER_SITE_OTHER): Payer: Self-pay

## 2013-03-13 ENCOUNTER — Encounter: Payer: Self-pay | Admitting: Internal Medicine

## 2013-03-13 ENCOUNTER — Ambulatory Visit (INDEPENDENT_AMBULATORY_CARE_PROVIDER_SITE_OTHER): Payer: BC Managed Care – PPO | Admitting: Internal Medicine

## 2013-03-13 VITALS — BP 110/70 | HR 66 | Temp 98.1°F | Ht 64.0 in | Wt 138.0 lb

## 2013-03-13 DIAGNOSIS — M25539 Pain in unspecified wrist: Secondary | ICD-10-CM

## 2013-03-13 DIAGNOSIS — L409 Psoriasis, unspecified: Secondary | ICD-10-CM

## 2013-03-13 DIAGNOSIS — E7800 Pure hypercholesterolemia, unspecified: Secondary | ICD-10-CM

## 2013-03-13 DIAGNOSIS — L408 Other psoriasis: Secondary | ICD-10-CM

## 2013-03-13 DIAGNOSIS — F439 Reaction to severe stress, unspecified: Secondary | ICD-10-CM

## 2013-03-13 DIAGNOSIS — G459 Transient cerebral ischemic attack, unspecified: Secondary | ICD-10-CM

## 2013-03-13 DIAGNOSIS — I10 Essential (primary) hypertension: Secondary | ICD-10-CM

## 2013-03-13 DIAGNOSIS — M25531 Pain in right wrist: Secondary | ICD-10-CM | POA: Insufficient documentation

## 2013-03-13 DIAGNOSIS — Z733 Stress, not elsewhere classified: Secondary | ICD-10-CM

## 2013-03-13 NOTE — Assessment & Plan Note (Signed)
On prozac 10mg  q day.  Doing better.  Does not feel she needs anything more.  Follow.

## 2013-03-13 NOTE — Progress Notes (Signed)
Pre-visit discussion using our clinic review tool. No additional management support is needed unless otherwise documented below in the visit note.  

## 2013-03-13 NOTE — Assessment & Plan Note (Signed)
No reoccurring symptoms.  Continue daily aspirin.  Follow.

## 2013-03-13 NOTE — Assessment & Plan Note (Signed)
Blood pressure under better control.  Continue same medication.  Follow.  Follow metabolic panel.

## 2013-03-13 NOTE — Assessment & Plan Note (Addendum)
Lipid profile 8/14 revealed total cholesterol 215, triglycerides 190, HDL 64 and LDL 128.  Continue low cholesterol diet and exercise.  If cholesterol remains elevated, will require medication.  Follow.

## 2013-03-13 NOTE — Assessment & Plan Note (Signed)
Seeing dermatology.  Cream helping.  Follow.

## 2013-03-13 NOTE — Assessment & Plan Note (Signed)
Persistent pain despite antiinflammatories, wrist splint and a glove.  Increased soft tissue swelling and pain - base of thumb and wrist.  Will refer to ortho for further evaluation and treatment.

## 2013-03-13 NOTE — Progress Notes (Signed)
Subjective:    Patient ID: Ariana Hartman, female    DOB: [birth year], 63 y.o.   MRN: [id number]  HPI 63 year old female with past history of hypertension who comes in today for a scheduled follow up.  Her father passed away 10-Nov-2012.  She was having issues with being more emotional and trouble sleeping previously.  See previous notes for details.  Was started on prozac.  Comes in today for f/u.  States she feels better.  Is sleeping better.  Does not feel she needs anything more at this time.  Doing well.  Blood pressure has been under good control.  No headache or lightheadedness/dizziness. No chest pain or tightness.  Breathing doing well.  She is having increased pain in her right wrist.  Present for four months now.  Has tried alleve regularly and has applied hot and cold.  No relief.  Some weakness.  Has tried a thumb spica and a glove.  No relief.     Past Medical History  Diagnosis Date  . History of chicken pox   . Heart murmur   . Hypertension     Current Outpatient Prescriptions on File Prior to Visit  Medication Sig Dispense Refill  . aspirin 81 MG tablet Take 81 mg by mouth daily.      Marland Kitchen b complex vitamins tablet Take 1 tablet by mouth daily.      Marland Kitchen FLUoxetine (PROZAC) 10 MG capsule Take 1 capsule (10 mg total) by mouth daily.  30 capsule  2  . Ginger, Zingiber officinalis, (GINGER ROOT PO) Take by mouth.      Marland Kitchen lisinopril-hydrochlorothiazide (ZESTORETIC) 20-12.5 MG per tablet Take 1 tablet by mouth daily.  90 tablet  1   No current facility-administered medications on file prior to visit.    Review of Systems Patient denies any lightheadedness or dizziness. No headache.  No vision change.   No chest pain, tightness or palpitations.  No increased shortness of breath, cough or congestion.  No vomiting.  No acid reflux.  No abdominal pain or cramping.  No bowel change, such as diarrhea, constipation, BRBPR or melana.   Has been eating better.  Sleeping better.  Prozac working  well.  Does not feel she needs anything more at this point.  She does report having right wrist pain and pain at the base of her thumb - present for four months.  Increased pain with gripping.  Taking antiinflammatories and has tried a splint and glove.  No improvement.  She has noticed some decreased strength.      Objective:   Physical Exam  Filed Vitals:   03/13/13 0758  BP: 110/70  Pulse: 66  Temp: 98.1 F (36.7 C)   Blood pressure recheck:  82/32  63 year old female in no acute distress.   HEENT:  Nares- clear.  Oropharynx - without lesions. NECK:  Supple.  Nontender.  No audible bruit.  HEART:  Appears to be regular. LUNGS:  No crackles or wheezing audible.  Respirations even and unlabored.  RADIAL PULSE:  Equal bilaterally.  ABDOMEN:  Soft, nontender.  Bowel sounds present and normal.  No audible abdominal bruit.   EXTREMITIES:  No increased edema present.  DP pulses palpable and equal bilaterally.   MSK:  Increased pain base of thumb and radial aspect of wrist.  Some minimal increased soft tissue swelling.  No increased erythema.         Assessment & Plan:  HEADACHE.  Not a problem now.  Blood pressure better.  Follow.     VISUAL DISTURBANCE.  Described the blurring as outlined previously.  See previous notes.  Had MRI and carotid ultrasound.  Carotid ultrasound revealed no significant stenosis.  MRI revealed no acute changes.  Saw Neurology.  See Dr Daisy BlossomPotter's note for details.  Previous changes felt to possibly be due to a TIA.  Continue daily aspirin.  Follow.  Has had no reoccurring symptoms.     CARDIOVASCULAR.  Recent stress test negative.  Currently asymptomatic.  Follow.  Continue risk factor modification.   HEALTH MAINTENANCE.  Physical 07/27/12.  Mammogram 07/12/12 - Birads II.  Needs colonoscopy.  Will notify me when agreeable.

## 2013-03-27 ENCOUNTER — Encounter: Payer: Self-pay | Admitting: Internal Medicine

## 2013-03-27 ENCOUNTER — Ambulatory Visit (INDEPENDENT_AMBULATORY_CARE_PROVIDER_SITE_OTHER): Payer: BC Managed Care – PPO | Admitting: Internal Medicine

## 2013-03-27 ENCOUNTER — Encounter: Payer: Self-pay | Admitting: *Deleted

## 2013-03-27 VITALS — BP 130/80 | HR 68 | Temp 98.2°F | Ht 64.0 in | Wt 140.5 lb

## 2013-03-27 DIAGNOSIS — R6889 Other general symptoms and signs: Secondary | ICD-10-CM

## 2013-03-27 DIAGNOSIS — I10 Essential (primary) hypertension: Secondary | ICD-10-CM

## 2013-03-27 DIAGNOSIS — R509 Fever, unspecified: Secondary | ICD-10-CM | POA: Insufficient documentation

## 2013-03-27 LAB — POCT INFLUENZA A/B
INFLUENZA B, POC: NEGATIVE
Influenza A, POC: NEGATIVE

## 2013-03-27 MED ORDER — OSELTAMIVIR PHOSPHATE 75 MG PO CAPS: 75.0000 mg | ORAL_CAPSULE | Freq: Two times a day (BID) | ORAL | Status: DC

## 2013-03-27 NOTE — Progress Notes (Signed)
  Subjective:    Patient ID: Ariana HainesKori Hartman, female    DOB: Jul 13, 1950, 63 y.o.   MRN: [id number]  Fever   63 year old female with past history of hypertension who comes in today as a work in with concerns regarding fever, body aches and diarrhea.  Symptoms started two days ago.  Went to work and started aching.  Developed some diarrhea and felt cold.  Temp this am and yesterday was 101.  Has been drinking.  Eating some.  Some decreased appetite.  Still with body aches.  Diarrhea not as bad.  Still some loose stool.  Dry cough.  No vomiting.      Past Medical History  Diagnosis Date  . History of chicken pox   . Heart murmur   . Hypertension     Current Outpatient Prescriptions on File Prior to Visit  Medication Sig Dispense Refill  . aspirin 81 MG tablet Take 81 mg by mouth daily.      Marland Kitchen. b complex vitamins tablet Take 1 tablet by mouth daily.      Marland Kitchen. FLUoxetine (PROZAC) 10 MG capsule Take 1 capsule (10 mg total) by mouth daily.  30 capsule  2  . lisinopril-hydrochlorothiazide (ZESTORETIC) 20-12.5 MG per tablet Take 1 tablet by mouth daily.  90 tablet  1  . Ginger, Zingiber officinalis, (GINGER ROOT PO) Take by mouth.       No current facility-administered medications on file prior to visit.    Review of Systems  Constitutional: Positive for fever.  Patient denies any lightheadedness or dizziness. Some headache.  Not severe.  Is some better today.   No chest pain or tightness.   No increased shortness of breath, but does report the cough.  No vomiting.  No abdominal pain.  Some diarrhea as outlined.  Body aches as described.  Describes neck aching, arms and legs aching.  Some better.       Objective:   Physical Exam  Filed Vitals:   03/27/13 1508  BP: 130/80  Pulse: 68  Temp: 98.2 F (7436.748 C)   63 year old female in no acute distress.   HEENT:  Nares- slightly erythematous turbinates.  Oropharynx - without lesions.  TMs without erythema.  NECK:  Supple.  Nontender.  HEART:   Appears to be regular. LUNGS:  No crackles or wheezing audible.  Respirations even and unlabored.        Assessment & Plan:  HEALTH MAINTENANCE.  Physical 07/27/12.  Mammogram 07/12/12 - Birads II.  Needs colonoscopy.  Will notify me when agreeable.

## 2013-03-27 NOTE — Assessment & Plan Note (Signed)
Blood pressure under better control.  Continue same medication.  Follow.  Follow metabolic panel.   

## 2013-03-27 NOTE — Progress Notes (Signed)
Pre-visit discussion using our clinic review tool. No additional management support is needed unless otherwise documented below in the visit note.  

## 2013-03-27 NOTE — Assessment & Plan Note (Signed)
Acute fever, body aches, cough and diarrhea.  Has been exposed to people with similar symptoms.  Concern over possible influenza.  Treat with tamiflu as directed.  Rest.  Fluids.  Tylenol as directed.  Explained to her if symptoms changed, worsened or do not resolve - needs reevaluation.

## 2013-03-30 ENCOUNTER — Other Ambulatory Visit (INDEPENDENT_AMBULATORY_CARE_PROVIDER_SITE_OTHER): Payer: BC Managed Care – PPO

## 2013-03-30 DIAGNOSIS — E7800 Pure hypercholesterolemia, unspecified: Secondary | ICD-10-CM

## 2013-03-30 DIAGNOSIS — G459 Transient cerebral ischemic attack, unspecified: Secondary | ICD-10-CM

## 2013-03-30 DIAGNOSIS — I10 Essential (primary) hypertension: Secondary | ICD-10-CM

## 2013-03-30 LAB — COMPREHENSIVE METABOLIC PANEL
ALBUMIN: 4.3 g/dL (ref 3.5–5.2)
ALT: 23 U/L (ref 0–35)
AST: 29 U/L (ref 0–37)
Alkaline Phosphatase: 93 U/L (ref 39–117)
BUN: 17 mg/dL (ref 6–23)
CO2: 25 meq/L (ref 19–32)
Calcium: 9.5 mg/dL (ref 8.4–10.5)
Chloride: 102 mEq/L (ref 96–112)
Creatinine, Ser: 0.9 mg/dL (ref 0.4–1.2)
GFR: 67.87 mL/min (ref >60.00)
Glucose, Bld: 90 mg/dL (ref 70–99)
Potassium: 4.5 mEq/L (ref 3.5–5.1)
SODIUM: 135 meq/L (ref 135–145)
TOTAL PROTEIN: 7.5 g/dL (ref 6.0–8.3)
Total Bilirubin: 0.4 mg/dL (ref 0.3–1.2)

## 2013-03-30 LAB — CBC WITH DIFFERENTIAL/PLATELET
BASOS ABS: 0 10*3/uL (ref 0.0–0.1)
Basophils Relative: 0.4 % (ref 0.0–3.0)
Eosinophils Absolute: 0 10*3/uL (ref 0.0–0.7)
Eosinophils Relative: 0.1 % (ref 0.0–5.0)
HCT: 31.4 % — ABNORMAL LOW (ref 36.0–46.0)
Hemoglobin: 10.7 g/dL — ABNORMAL LOW (ref 12.0–15.0)
Lymphocytes Relative: 20.3 % (ref 12.0–46.0)
Lymphs Abs: 1.9 10*3/uL (ref 0.7–4.0)
MCHC: 34 g/dL (ref 30.0–36.0)
MCV: 97.5 fl (ref 78.0–100.0)
MONOS PCT: 4 % (ref 3.0–12.0)
Monocytes Absolute: 0.4 10*3/uL (ref 0.1–1.0)
Neutro Abs: 6.9 10*3/uL (ref 1.4–7.7)
Neutrophils Relative %: 75.2 % (ref 43.0–77.0)
PLATELETS: 290 10*3/uL (ref 150.0–400.0)
RBC: 3.22 Mil/uL — ABNORMAL LOW (ref 3.87–5.11)
RDW: 13.3 % (ref 11.5–14.6)
WBC: 9.2 10*3/uL (ref 4.5–10.5)

## 2013-03-30 LAB — LDL CHOLESTEROL, DIRECT: Direct LDL: 148.6 mg/dL

## 2013-03-30 LAB — LIPID PANEL
CHOLESTEROL: 256 mg/dL — AB (ref 0–200)
HDL: 98.9 mg/dL (ref >39.00)
Total CHOL/HDL Ratio: 3
Triglycerides: 63 mg/dL (ref 0.0–149.0)
VLDL: 12.6 mg/dL (ref 0.0–40.0)

## 2013-03-31 ENCOUNTER — Other Ambulatory Visit: Payer: Self-pay | Admitting: Internal Medicine

## 2013-03-31 DIAGNOSIS — D649 Anemia, unspecified: Secondary | ICD-10-CM

## 2013-03-31 NOTE — Progress Notes (Signed)
Orders placed for f/u labs.  

## 2013-04-11 ENCOUNTER — Other Ambulatory Visit: Payer: Self-pay | Admitting: *Deleted

## 2013-04-11 MED ORDER — PRAVASTATIN SODIUM 10 MG PO TABS: 10.0000 mg | ORAL_TABLET | Freq: Every day | ORAL | Status: DC

## 2013-04-11 MED ORDER — FLUOXETINE HCL 10 MG PO CAPS: 10.0000 mg | ORAL_CAPSULE | Freq: Every day | ORAL | Status: DC

## 2013-04-21 ENCOUNTER — Other Ambulatory Visit (INDEPENDENT_AMBULATORY_CARE_PROVIDER_SITE_OTHER): Payer: BC Managed Care – PPO

## 2013-04-21 DIAGNOSIS — D649 Anemia, unspecified: Secondary | ICD-10-CM

## 2013-04-21 LAB — CBC WITH DIFFERENTIAL/PLATELET
BASOS ABS: 0.1 10*3/uL (ref 0.0–0.1)
BASOS PCT: 1 % (ref 0.0–3.0)
Eosinophils Absolute: 0.4 10*3/uL (ref 0.0–0.7)
Eosinophils Relative: 5.8 % — ABNORMAL HIGH (ref 0.0–5.0)
HCT: 34.8 % — ABNORMAL LOW (ref 36.0–46.0)
HEMOGLOBIN: 11.4 g/dL — AB (ref 12.0–15.0)
LYMPHS PCT: 36.5 % (ref 12.0–46.0)
Lymphs Abs: 2.4 10*3/uL (ref 0.7–4.0)
MCHC: 32.8 g/dL (ref 30.0–36.0)
MCV: 100.8 fl — ABNORMAL HIGH (ref 78.0–100.0)
Monocytes Absolute: 0.6 10*3/uL (ref 0.1–1.0)
Monocytes Relative: 8.5 % (ref 3.0–12.0)
NEUTROS PCT: 48.2 % (ref 43.0–77.0)
Neutro Abs: 3.2 10*3/uL (ref 1.4–7.7)
Platelets: 276 10*3/uL (ref 150.0–400.0)
RBC: 3.46 Mil/uL — AB (ref 3.87–5.11)
RDW: 12.7 % (ref 11.5–14.6)
WBC: 6.7 10*3/uL (ref 4.5–10.5)

## 2013-04-21 LAB — FERRITIN: Ferritin: 73.7 ng/mL (ref 10.0–291.0)

## 2013-04-21 LAB — IBC PANEL
IRON: 95 ug/dL (ref 42–145)
SATURATION RATIOS: 25.3 % (ref 20.0–50.0)
TRANSFERRIN: 268.1 mg/dL (ref 212.0–360.0)

## 2013-04-21 LAB — VITAMIN B12: Vitamin B-12: 975 pg/mL — ABNORMAL HIGH (ref 211–911)

## 2013-04-23 IMAGING — US US CAROTID DUPLEX BILAT
1 series · 14 of 24 positions shown · non-contrast
Comparison: none

REASON FOR EXAM: BLURRED VISION
COMMENTS:

[Series 1: us carotid duplex bilat · 0.06mm/px · 14 of 57 slices shown]
[im 1/57]
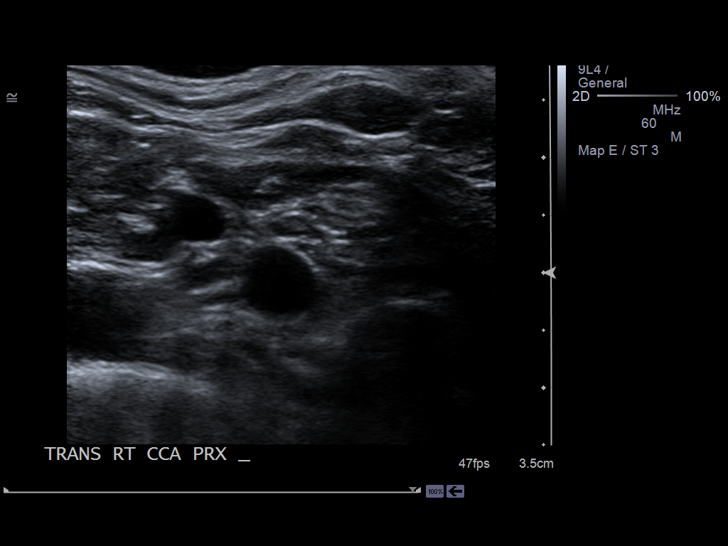
[im 5/57]
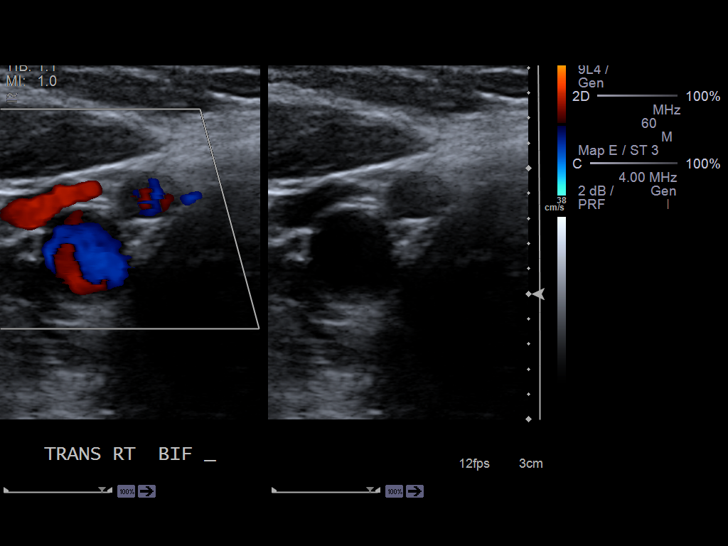
[im 10/57]
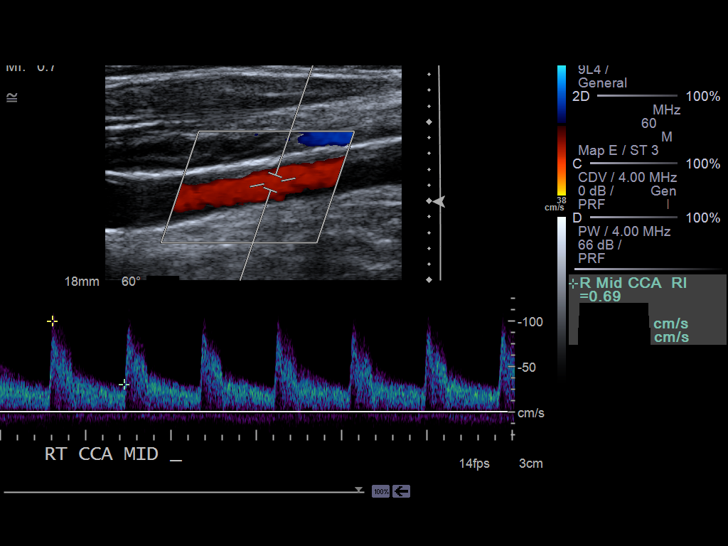
[im 15/57]
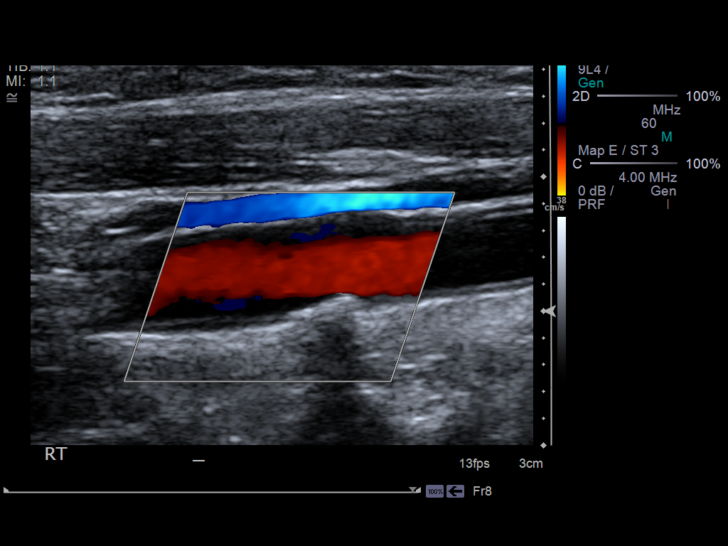
[im 18/57]
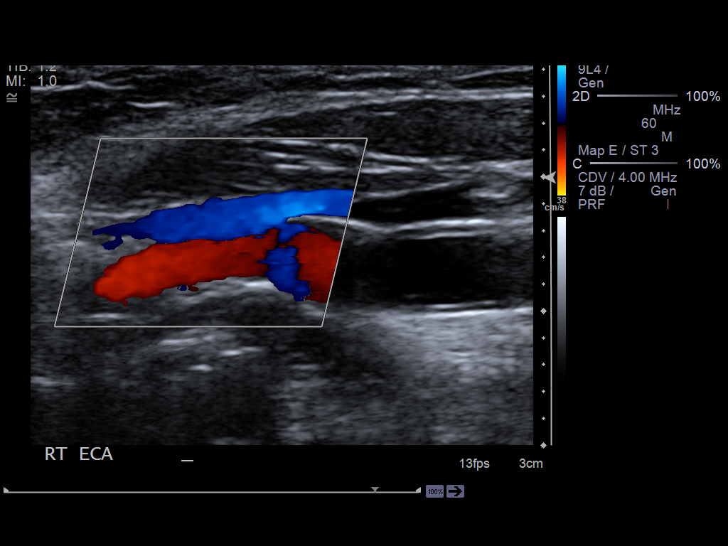
[im 22/57]
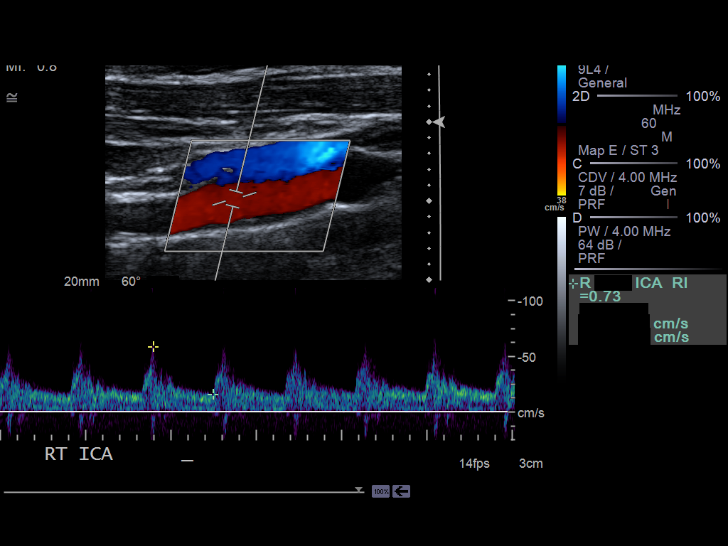
[im 27/57]
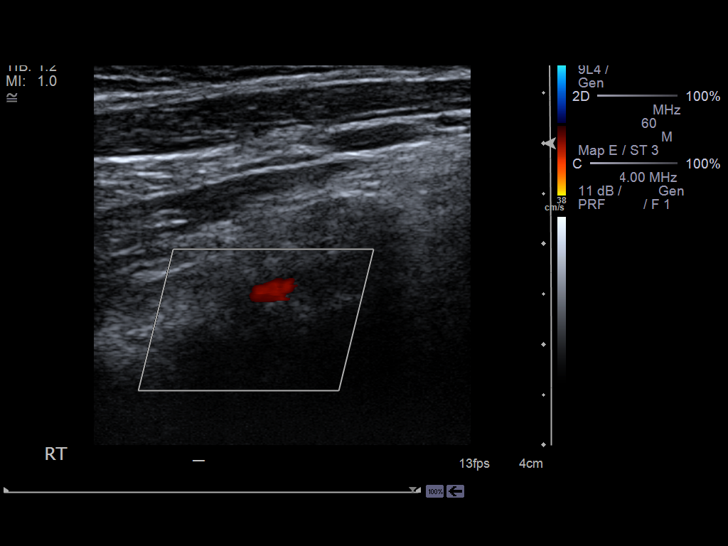
[im 30/57]
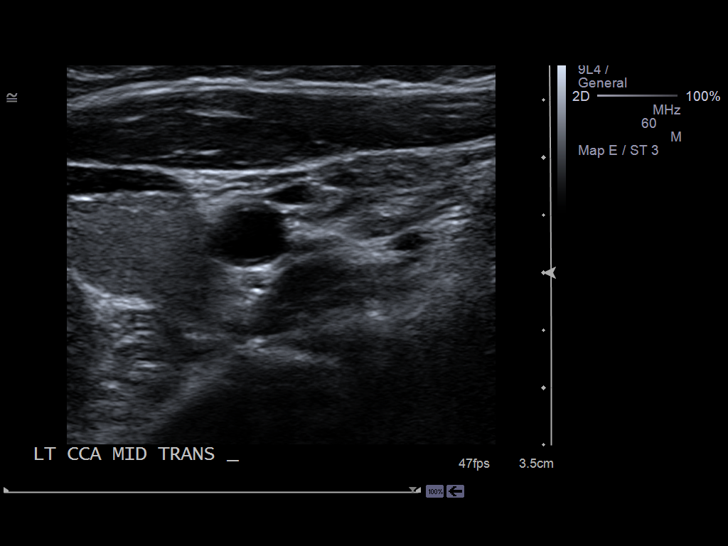
[im 35/57]
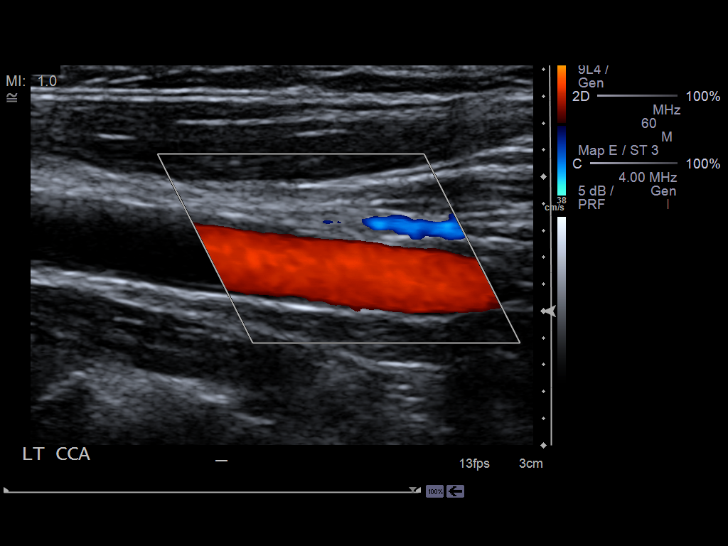
[im 39/57]
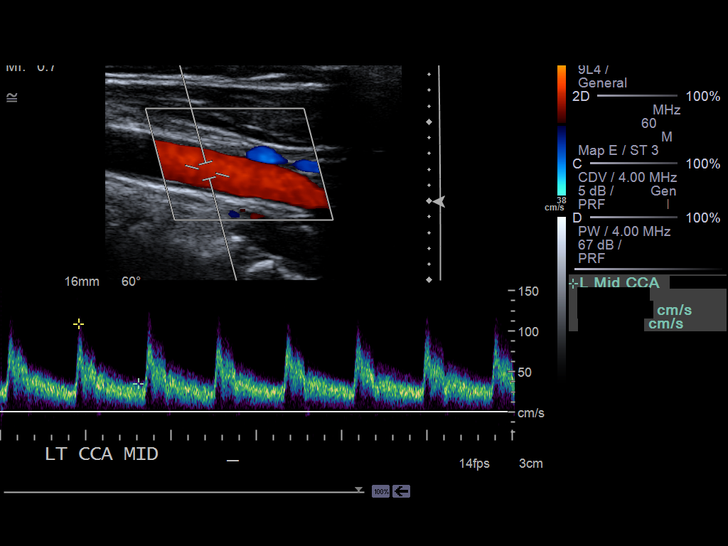
[im 44/57]
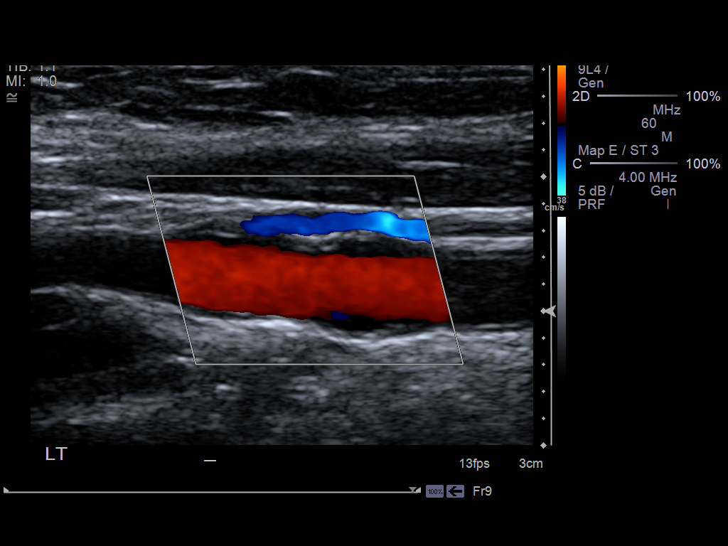
[im 47/57]
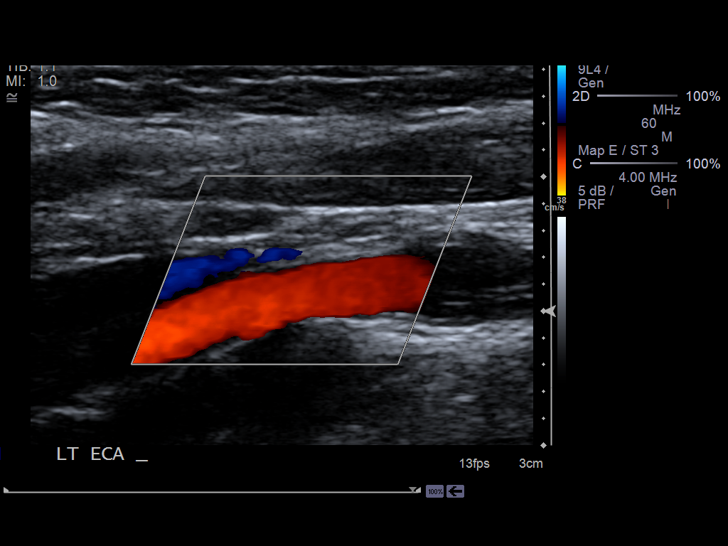
[im 52/57]
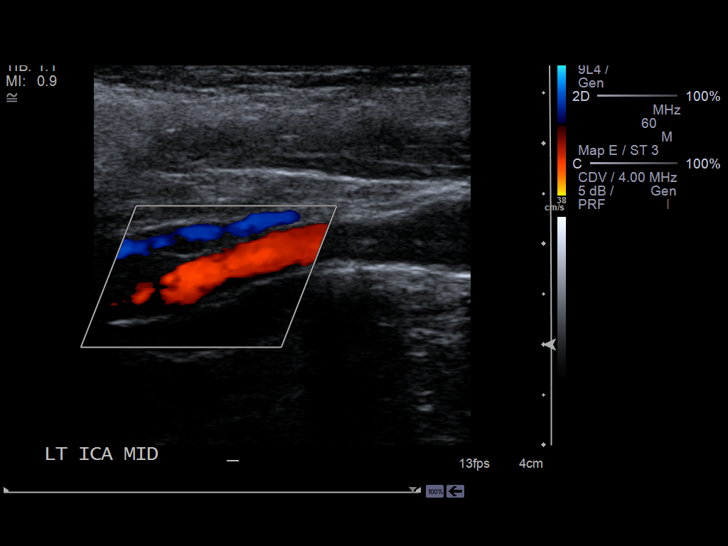
[im 57/57]
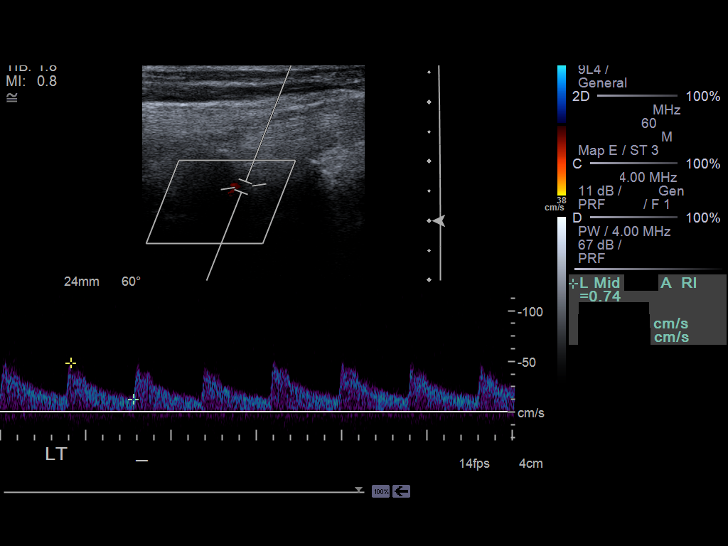

[14 of 24 positions shown; findings below may reference images not displayed]

PROCEDURE:     US  - US CAROTID DOPPLER BILATERAL  - February 08, 2012  [DATE]

RESULT:     Grayscale and color flow Doppler evaluation of the cervical
carotid arteries was performed.

On the right there is a small amount of soft and calcified plaque at the
level of the carotid bulb. On the left there is a small amount of soft
plaque at the carotid bulb. The waveform patterns are normal and the color
flow images are normal.

On the right the peak internal carotid systolic velocity measured 116 cm/sec
and the peak common carotid velocity measured 108 cm/sec corresponding to a
normal ratio of 1.1. On the left the peak internal carotid systolic velocity
measured 94 cm/sec and the peak common carotid velocity measured 111 cm/sec
corresponding to a normal ratio of 0.8. The vertebral arteries are normal in
flow direction bilaterally.
IMPRESSION: There is no evidence of a hemodynamically significant
carotid stenosis.

[REDACTED]

## 2013-04-25 ENCOUNTER — Other Ambulatory Visit: Payer: Self-pay | Admitting: Internal Medicine

## 2013-04-25 DIAGNOSIS — D649 Anemia, unspecified: Secondary | ICD-10-CM

## 2013-04-25 DIAGNOSIS — E7800 Pure hypercholesterolemia, unspecified: Secondary | ICD-10-CM

## 2013-04-25 NOTE — Progress Notes (Signed)
F/u labs ordered.   

## 2013-05-02 ENCOUNTER — Other Ambulatory Visit: Payer: Self-pay | Admitting: Internal Medicine

## 2013-05-02 DIAGNOSIS — Z1211 Encounter for screening for malignant neoplasm of colon: Secondary | ICD-10-CM

## 2013-05-02 NOTE — Progress Notes (Signed)
Order placed for IFOB 

## 2013-05-19 ENCOUNTER — Other Ambulatory Visit (INDEPENDENT_AMBULATORY_CARE_PROVIDER_SITE_OTHER): Payer: BC Managed Care – PPO

## 2013-05-19 DIAGNOSIS — D649 Anemia, unspecified: Secondary | ICD-10-CM

## 2013-05-19 DIAGNOSIS — E7800 Pure hypercholesterolemia, unspecified: Secondary | ICD-10-CM

## 2013-05-19 LAB — BASIC METABOLIC PANEL
BUN: 16 mg/dL (ref 6–23)
CALCIUM: 9.1 mg/dL (ref 8.4–10.5)
CO2: 29 mEq/L (ref 19–32)
CREATININE: 0.8 mg/dL (ref 0.4–1.2)
Chloride: 105 mEq/L (ref 96–112)
GFR: 80.02 mL/min (ref >60.00)
Glucose, Bld: 77 mg/dL (ref 70–99)
Potassium: 5 mEq/L (ref 3.5–5.1)
Sodium: 143 mEq/L (ref 135–145)

## 2013-05-19 LAB — HEPATIC FUNCTION PANEL
ALT: 18 U/L (ref 0–35)
AST: 29 U/L (ref 0–37)
Albumin: 4.1 g/dL (ref 3.5–5.2)
Alkaline Phosphatase: 83 U/L (ref 39–117)
Bilirubin, Direct: 0 mg/dL (ref 0.0–0.3)
Total Bilirubin: 0.3 mg/dL (ref 0.3–1.2)
Total Protein: 6.5 g/dL (ref 6.0–8.3)

## 2013-05-19 LAB — HEMOGLOBIN: Hemoglobin: 11 g/dL — ABNORMAL LOW (ref 12.0–15.0)

## 2013-05-22 ENCOUNTER — Other Ambulatory Visit: Payer: Self-pay | Admitting: Internal Medicine

## 2013-05-22 DIAGNOSIS — D649 Anemia, unspecified: Secondary | ICD-10-CM

## 2013-05-22 DIAGNOSIS — E7800 Pure hypercholesterolemia, unspecified: Secondary | ICD-10-CM

## 2013-05-22 DIAGNOSIS — I10 Essential (primary) hypertension: Secondary | ICD-10-CM

## 2013-05-22 NOTE — Progress Notes (Signed)
Orders placed for f/u labs.  

## 2013-06-05 ENCOUNTER — Other Ambulatory Visit: Payer: Self-pay | Admitting: Internal Medicine

## 2013-07-03 ENCOUNTER — Other Ambulatory Visit: Payer: Self-pay | Admitting: Internal Medicine

## 2013-07-28 ENCOUNTER — Encounter: Payer: BC Managed Care – PPO | Admitting: Internal Medicine

## 2013-08-14 ENCOUNTER — Other Ambulatory Visit: Payer: Self-pay | Admitting: Internal Medicine

## 2013-10-03 ENCOUNTER — Other Ambulatory Visit: Payer: Self-pay | Admitting: Internal Medicine

## 2013-10-10 ENCOUNTER — Ambulatory Visit: Payer: BC Managed Care – PPO | Admitting: Internal Medicine

## 2013-10-10 ENCOUNTER — Telehealth: Payer: Self-pay | Admitting: Internal Medicine

## 2013-10-10 DIAGNOSIS — Z0000 Encounter for general adult medical examination without abnormal findings: Secondary | ICD-10-CM

## 2013-10-10 NOTE — Telephone Encounter (Signed)
Pt came to the desk at 3:05 and stated that she didn't feel good and needed to reschedule appt. Pt scheduled for September. Pt stated that she needed to be seen before the end of the month. Pt added to the waitlist./msn

## 2013-10-10 NOTE — Progress Notes (Signed)
Pre visit review using our clinic review tool, if applicable. No additional management support is needed unless otherwise documented below in the visit note.  Pt left prior to being seen.  See phone message.

## 2013-10-10 NOTE — Telephone Encounter (Signed)
Is her scheduled physical ok with her or does she need an earlier time.

## 2013-10-10 NOTE — Telephone Encounter (Signed)
See note below re: today's appt

## 2013-10-11 NOTE — Telephone Encounter (Signed)
She is aware of date and time of cpe and is ok with it.

## 2013-10-11 NOTE — Telephone Encounter (Signed)
Pt came in today & dropped off a health screening form that needs to be completed & returned by 11/07/13. She rescheduled her physical to 12/05/13 (see below for reason). Form placed in your folder for review.

## 2013-10-11 NOTE — Telephone Encounter (Signed)
Form completed.  In your basket.  Need charge form.

## 2013-10-11 NOTE — Telephone Encounter (Signed)
noted 

## 2013-10-12 NOTE — Telephone Encounter (Signed)
Form faxed. Pt aware it has been faxed

## 2013-10-17 ENCOUNTER — Other Ambulatory Visit: Payer: Self-pay | Admitting: Internal Medicine

## 2013-10-17 NOTE — Telephone Encounter (Signed)
Refilled prozac #30 with 2 refills.

## 2013-10-17 NOTE — Telephone Encounter (Signed)
Last refill 6.27.15, last OV 1.19.15, next OV 9.29.15.  Please advise refill.

## 2013-11-21 ENCOUNTER — Other Ambulatory Visit: Payer: Self-pay | Admitting: Internal Medicine

## 2013-11-21 NOTE — Telephone Encounter (Signed)
Appt 12/05/13.

## 2013-12-05 ENCOUNTER — Encounter: Payer: BC Managed Care – PPO | Admitting: Internal Medicine

## 2014-01-08 ENCOUNTER — Telehealth: Payer: Self-pay | Admitting: *Deleted

## 2014-01-08 NOTE — Telephone Encounter (Signed)
I called pt per pts request.  Explained to her that I was returning her call.  She began by telling me that she cannot get an appointment at the office.  She then stated that I referred her to a dermatologist that "took her for 2500 dollars".  She was concerned about a persistent rash.  Stated she had one of her friends tell her it was psoriasis and that she did not understand why we have not given her a diagnosis.  States it is persistent.  The more she talked, the more she raised her voice.  She was yelling and rude on the phone.  When I tried to respond, she cut me off.  We discussed the rash in more detail.  She was angry and requested to be sent to get a "professional opinion".  I explained to her that I would take care of her referral.  I then tried to discuss with her regarding an appt.  The last contact she had with our office prior to this phone call was in August.  There was no other phone call to me requesting an appointment or even letting me know she had a problem.  I went through the last messages that we had documented in the chart.  I then also explained to her that she left before I could see her - for her last appt.  She stated that was because she had to wait too long and that it was "obvious to her that (I) did not want to see her"  I explained to her that I was running behind, but that she also showed up early for her appt (that extended the wait).  She was scheduled another appt.  I informed her that I even sent a note after I found out the appt was rescheduled to see if she needed an earlier appt.  (after reviewing phone messages from 10/11/13).  She then informed me that I did not complete her insurance form correctly and that it was my fault that she had to lose her insurance (and cost her money).  I explained to her that my nurse spoke with the insurance company and explained we had to put the information separate secondary to being performed on different dates).  Again was very rude in her  response to me.  I then informed her that she was expressing a lot of dissatisfaction with me and the staff and that if she was that unhappy, that she need to find a new provider.  She then stated that she was not unhappy with me, but did not think the staff here was doing there job.  She then again began complaining about some of the issues above and raising her voice.  I again informed her that if she was this upset with our office, that I think it would be better if she found a new physician.  I also explained to her that we would continue to address any acute issues for 30 days and that she would need to let me know who her new PCP would be.  Once notified, we could help facilitate transfer of her medical records.  She stated "so you are not going to see me anymore".  I explained that I did not think it was appropriate the way she was yelling at me on the phone and that with the amount of dissatisfaction she expressed, that I felt it would be better if she found a new physician.  She stated then she  would report me to the medical board and hung up the phone.

## 2014-01-08 NOTE — Telephone Encounter (Signed)
Pt called me requesting a call back from Dr. Lorin PicketScott. I asked patient for more information, & she stated it is regarding her overall health issue & it was personal. Pt would not give me any other information.

## 2014-01-11 ENCOUNTER — Other Ambulatory Visit: Payer: Self-pay | Admitting: Internal Medicine

## 2014-01-11 ENCOUNTER — Telehealth: Payer: Self-pay | Admitting: Internal Medicine

## 2014-01-11 DIAGNOSIS — R21 Rash and other nonspecific skin eruption: Secondary | ICD-10-CM

## 2014-01-11 NOTE — Telephone Encounter (Signed)
Order placed for dermatology referral.  

## 2014-01-24 ENCOUNTER — Other Ambulatory Visit: Payer: Self-pay | Admitting: Internal Medicine

## 2014-01-24 NOTE — Telephone Encounter (Signed)
Refill 30 days only, 90 days per request? I wasn't sure after last telephone note.

## 2014-01-24 NOTE — Telephone Encounter (Signed)
I refilled her prozac for #30 with no refills.

## 2014-01-30 ENCOUNTER — Encounter: Payer: Self-pay | Admitting: *Deleted

## 2014-01-30 ENCOUNTER — Other Ambulatory Visit: Payer: Self-pay | Admitting: Internal Medicine

## 2014-01-31 ENCOUNTER — Encounter: Payer: BC Managed Care – PPO | Admitting: Internal Medicine

## 2014-02-07 ENCOUNTER — Other Ambulatory Visit: Payer: Self-pay | Admitting: Internal Medicine

## 2014-02-07 NOTE — Telephone Encounter (Signed)
We refilled this on 01/30/14.  See medication list.  She is the one that I have discharged.  See my last phone note.

## 2014-02-07 NOTE — Telephone Encounter (Signed)
Pt cancelled, NS, or did not stay for last 4 appointments.  Please advise refill.

## 2015-01-23 ENCOUNTER — Other Ambulatory Visit: Payer: Self-pay | Admitting: Internal Medicine

## 2015-01-23 DIAGNOSIS — Z1231 Encounter for screening mammogram for malignant neoplasm of breast: Secondary | ICD-10-CM

## 2015-01-29 ENCOUNTER — Ambulatory Visit
Admission: RE | Admit: 2015-01-29 | Discharge: 2015-01-29 | Disposition: A | Discharge status: Home / Self Care | Payer: BLUE CROSS/BLUE SHIELD | Source: Ambulatory Visit | Attending: Internal Medicine | Admitting: Internal Medicine

## 2015-01-29 DIAGNOSIS — Z1231 Encounter for screening mammogram for malignant neoplasm of breast: Secondary | ICD-10-CM | POA: Insufficient documentation

## 2015-12-16 ENCOUNTER — Emergency Department: Payer: BLUE CROSS/BLUE SHIELD

## 2015-12-16 ENCOUNTER — Emergency Department
Admission: EM | Admit: 2015-12-16 | Discharge: 2015-12-16 | Disposition: A | Discharge status: Home / Self Care | Payer: BLUE CROSS/BLUE SHIELD | Attending: Emergency Medicine | Admitting: Emergency Medicine

## 2015-12-16 DIAGNOSIS — X500XXA Overexertion from strenuous movement or load, initial encounter: Secondary | ICD-10-CM | POA: Diagnosis not present

## 2015-12-16 DIAGNOSIS — Y9389 Activity, other specified: Secondary | ICD-10-CM | POA: Insufficient documentation

## 2015-12-16 DIAGNOSIS — Z79899 Other long term (current) drug therapy: Secondary | ICD-10-CM | POA: Diagnosis not present

## 2015-12-16 DIAGNOSIS — Y929 Unspecified place or not applicable: Secondary | ICD-10-CM | POA: Diagnosis not present

## 2015-12-16 DIAGNOSIS — M25332 Other instability, left wrist: Secondary | ICD-10-CM | POA: Diagnosis not present

## 2015-12-16 DIAGNOSIS — M25532 Pain in left wrist: Secondary | ICD-10-CM | POA: Diagnosis present

## 2015-12-16 DIAGNOSIS — I10 Essential (primary) hypertension: Secondary | ICD-10-CM | POA: Insufficient documentation

## 2015-12-16 DIAGNOSIS — Y990 Civilian activity done for income or pay: Secondary | ICD-10-CM | POA: Insufficient documentation

## 2015-12-16 DIAGNOSIS — G8929 Other chronic pain: Secondary | ICD-10-CM | POA: Diagnosis not present

## 2015-12-16 DIAGNOSIS — Z7982 Long term (current) use of aspirin: Secondary | ICD-10-CM | POA: Diagnosis not present

## 2015-12-16 MED ORDER — ACETAMINOPHEN-CODEINE #3 300-30 MG PO TABS: 1.0000 | ORAL_TABLET | ORAL | 0 refills | Status: AC | PRN

## 2015-12-16 NOTE — ED Triage Notes (Signed)
Left arm and wrist pain for the last 3 days. Pt had injury in January while at work that required surgery. Pt unable to get appt with surgeon and pain is severe for patient. Pt states that this is workers comp. Pt alert and oriented X4, active, cooperative, pt in NAD. RR even and unlabored, color WNL.

## 2015-12-16 NOTE — ED Provider Notes (Signed)
[facility] Emergency Department Provider Note   ____________________________________________   None    (approximate)  I have reviewed the triage vital signs and the nursing notes.   HISTORY  Chief Complaint Arm Pain    HPI Ariana Hartman is a 65 y.o. female presents for evaluation of left wrist and forearm pain. Patient states that this is a [employer] visit that she fell in January at work on the ninth and required wrist surgery on the 19th secondary to a fractured wrist. Patient presents today stating unable to get in to see her orthopedic surgeon and that she's having increased pain and concerned about continued wrist injury. She reports working out [employer] [place] and states that she lifts 150 pounds on a regular basis.   Past Medical History:  Diagnosis Date  . Heart murmur   . History of chicken pox   . Hypertension     Patient Active Problem List   Diagnosis Date Noted  . Febrile illness, acute 03/27/2013  . Right wrist pain 03/13/2013  . Stress 11/18/2012  . Hypercholesterolemia 07/31/2012  . Psoriasis 05/29/2012  . TIA (transient ischemic attack) 05/29/2012  . Hypertension 12/25/2011    Past Surgical History:  Procedure Laterality Date  . LASER ABLATION/CAUTERIZATION OF ENDOMETRIAL IMPLANTS  1990[place]  . OVARIAN CYST SURGERY  1988   ruptured ovarian cyst - [place]    Prior to Admission medications   Medication Sig Start Date End Date Taking? Authorizing Provider  acetaminophen-codeine (TYLENOL #3) 300-30 MG tablet Take 1-2 tablets by mouth every 4 (four) hours as needed for moderate pain. 12/16/15   Charmayne Sheerharles M Detrich Rakestraw, PA-C  aspirin 81 MG tablet Take 81 mg by mouth daily.    Historical Provider, MD  b complex vitamins tablet Take 1 tablet by mouth daily.    Historical Provider, MD  FLUoxetine (PROZAC) 10 MG capsule TAKE ONE CAPSULE BY MOUTH EVERY DAY 01/24/14   Dale Durhamharlene Scott, MD  Ginger,  Zingiber officinalis, (GINGER ROOT PO) Take by mouth.    Historical Provider, MD  lisinopril-hydrochlorothiazide (PRINZIDE,ZESTORETIC) 20-12.5 MG per tablet TAKE 1 TABLET BY MOUTH DAILY. 01/30/14   Dale Durhamharlene Scott, MD  pravastatin (PRAVACHOL) 10 MG tablet TAKE 1 TABLET BY MOUTH DAILY 10/03/13   Dale Durhamharlene Scott, MD  pravastatin (PRAVACHOL) 10 MG tablet TAKE 1 TABLET BY MOUTH DAILY 01/11/14   Dale Durhamharlene Scott, MD    Allergies Ampicillin and Penicillins  Family History  Problem Relation Age of Onset  . Alcohol abuse Mother   . Hypertension Father   . Breast cancer Maternal Grandmother   . Lung cancer Paternal Grandfather     Social History Social History  Substance Use Topics  . Smoking status: Never Smoker  . Smokeless tobacco: Never Used  . Alcohol use Yes     Comment: drinks a couple of glasses of wine per week    Review of Systems Constitutional: No fever/chills Musculoskeletal: Positive for left wrist pain. Skin: Negative for rash. Neurological: Negative for headaches, focal weakness or numbness.  10-point ROS otherwise negative.  ____________________________________________   PHYSICAL EXAM:  VITAL SIGNS: ED Triage Vitals  Enc Vitals Group     BP 12/16/15 1325 118/79     Pulse Rate 12/16/15 1325 77     Resp 12/16/15 1325 20     Temp 12/16/15 1325 98.2 F (36.8 C)     Temp Source 12/16/15 1325 Oral     SpO2 12/16/15 1325 94 %  Weight 12/16/15 1326 135 lb (61.2 kg)     Height 12/16/15 1326 5\' 3"  (1.6 m)     Head Circumference --      Peak Flow --      Pain Score 12/16/15 1329 6     Pain Loc --      Pain Edu? --      Excl. in GC? --     Constitutional: Alert and oriented. Well appearing and in no acute distress.   Cardiovascular: Normal rate, regular rhythm. Grossly normal heart sounds.  Good peripheral circulation. Respiratory: Normal respiratory effort.  No retractions. Lungs CTAB. Musculoskeletal:Left wrist with limited range of motion increased pain with  flexion. Positive strength and good grip. Distally neurovascularly intact with good capillary refill. Appears to be an indentation on the radial/palmar aspect of the wrist. Neurologic:  Normal speech and language. No gross focal neurologic deficits are appreciated. No gait instability. Skin:  Skin is warm, dry and intact. No rash noted. Psychiatric: Mood and affect are normal. Speech and behavior are normal.  ____________________________________________   LABS (all labs ordered are listed, but only abnormal results are displayed)  Labs Reviewed - No data to display ____________________________________________  EKG   ____________________________________________  RADIOLOGY   COMPARISON: None.    FINDINGS:  Distal left radius plate and screw fixation with underlying callus  about the distal left radius meta diaphysis. Hardware appears intact  without evidence of loosening. Distal radius and hardware alignment  is within normal limits.    Minimal chronic appearing irregularity of the left ulnar styloid.  Distal left ulna otherwise intact.    Questionable mild widening of the scapholunate interval. Carpal bone  alignment otherwise within normal limits. The scaphoid appears  intact. No carpal bone fracture identified. Metacarpals appear  intact.    IMPRESSION:  1. Distal left radius ORIF hardware with no adverse features.  2. Suggestion of mild widening of the scapholunate interval which  could indicate underlying scapholunate ligament injury and  predispose to SLAC wrist.  3. No acute osseous abnormality identified.    ____________________________________________   PROCEDURES  Procedure(s) performed: None  Procedures  Critical Care performed: No  ____________________________________________   INITIAL IMPRESSION / ASSESSMENT AND PLAN / ED COURSE  Pertinent labs & imaging results that were available during my care of the patient were reviewed by me and  considered in my medical decision making (see chart for details).  Continued left wrist pain secondary to fracture earlier this year. Possible scapholunate instability of the left wrist. Rx given for Tylenol 3 to take in addition to her ibuprofen. Left wrist cock up wrist splint provided and patient encouraged to follow up with her orthopedic doctor in [place].  Clinical Course     ____________________________________________   FINAL CLINICAL IMPRESSION(S) / ED DIAGNOSES  Final diagnoses:  Chronic pain of left wrist  Scapholunate instability of left wrist      NEW MEDICATIONS STARTED DURING THIS VISIT:  Discharge Medication List as of 12/16/2015  3:14 PM    START taking these medications   Details  acetaminophen-codeine (TYLENOL #3) 300-30 MG tablet Take 1-2 tablets by mouth every 4 (four) hours as needed for moderate pain., Starting Mon 12/16/2015, Print         Note:  This document was prepared using Dragon voice recognition software and may include unintentional dictation errors.   Evangeline Dakin, PA-C 12/16/15 1532    Jennye Moccasin, MD 12/16/15 [phone]
